# Patient Record
Sex: Female | Born: 1955 | Race: Asian | Hispanic: No | Smoking: Never smoker
Health system: Southern US, Community
[De-identification: ages and names within clinical notes are randomized; demographics above are authoritative.]

## PROBLEM LIST (undated history)

## (undated) HISTORY — PX: NECK SURGERY: SHX720

## (undated) HISTORY — PX: BREAST BIOPSY: SHX20

---

## 2002-07-04 ENCOUNTER — Encounter: Payer: Self-pay | Admitting: Family Medicine

## 2002-07-04 ENCOUNTER — Encounter: Admission: RE | Admit: 2002-07-04 | Discharge: 2002-07-04 | Payer: Self-pay | Admitting: Family Medicine

## 2002-08-19 ENCOUNTER — Encounter: Payer: Self-pay | Admitting: Family Medicine

## 2002-08-19 ENCOUNTER — Encounter: Admission: RE | Admit: 2002-08-19 | Discharge: 2002-08-19 | Payer: Self-pay | Admitting: Family Medicine

## 2002-12-25 ENCOUNTER — Ambulatory Visit (HOSPITAL_COMMUNITY): Admission: RE | Admit: 2002-12-25 | Discharge: 2002-12-25 | Payer: Self-pay | Admitting: Gastroenterology

## 2003-09-02 ENCOUNTER — Other Ambulatory Visit: Admission: RE | Admit: 2003-09-02 | Discharge: 2003-09-02 | Payer: Self-pay | Admitting: Family Medicine

## 2003-09-18 ENCOUNTER — Ambulatory Visit (HOSPITAL_COMMUNITY): Admission: RE | Admit: 2003-09-18 | Discharge: 2003-09-18 | Payer: Self-pay | Admitting: Family Medicine

## 2004-01-26 ENCOUNTER — Encounter: Admission: RE | Admit: 2004-01-26 | Discharge: 2004-02-23 | Payer: Self-pay | Admitting: Family Medicine

## 2004-09-01 ENCOUNTER — Emergency Department (HOSPITAL_COMMUNITY): Admission: EM | Admit: 2004-09-01 | Discharge: 2004-09-01 | Payer: Self-pay | Admitting: Emergency Medicine

## 2004-10-07 ENCOUNTER — Ambulatory Visit (HOSPITAL_COMMUNITY): Admission: RE | Admit: 2004-10-07 | Discharge: 2004-10-07 | Payer: Self-pay | Admitting: *Deleted

## 2005-10-09 ENCOUNTER — Ambulatory Visit (HOSPITAL_COMMUNITY): Admission: RE | Admit: 2005-10-09 | Discharge: 2005-10-09 | Payer: Self-pay | Admitting: Family Medicine

## 2006-03-14 ENCOUNTER — Other Ambulatory Visit: Admission: RE | Admit: 2006-03-14 | Discharge: 2006-03-14 | Payer: Self-pay | Admitting: Family Medicine

## 2006-10-16 ENCOUNTER — Ambulatory Visit (HOSPITAL_COMMUNITY): Admission: RE | Admit: 2006-10-16 | Discharge: 2006-10-16 | Payer: Self-pay | Admitting: Family Medicine

## 2006-12-05 ENCOUNTER — Encounter: Admission: RE | Admit: 2006-12-05 | Discharge: 2006-12-05 | Payer: Self-pay | Admitting: Family Medicine

## 2007-05-05 ENCOUNTER — Emergency Department (HOSPITAL_COMMUNITY): Admission: EM | Admit: 2007-05-05 | Discharge: 2007-05-05 | Payer: Self-pay | Admitting: Emergency Medicine

## 2007-10-13 ENCOUNTER — Emergency Department (HOSPITAL_COMMUNITY): Admission: EM | Admit: 2007-10-13 | Discharge: 2007-10-13 | Payer: Self-pay | Admitting: Emergency Medicine

## 2007-10-25 ENCOUNTER — Emergency Department (HOSPITAL_COMMUNITY): Admission: EM | Admit: 2007-10-25 | Discharge: 2007-10-26 | Payer: Self-pay | Admitting: Emergency Medicine

## 2007-12-09 ENCOUNTER — Encounter: Admission: RE | Admit: 2007-12-09 | Discharge: 2007-12-09 | Payer: Self-pay | Admitting: Family Medicine

## 2008-03-02 ENCOUNTER — Other Ambulatory Visit: Admission: RE | Admit: 2008-03-02 | Discharge: 2008-03-02 | Payer: Self-pay | Admitting: Family Medicine

## 2008-07-01 ENCOUNTER — Inpatient Hospital Stay (HOSPITAL_COMMUNITY): Admission: EM | Admit: 2008-07-01 | Discharge: 2008-07-09 | Payer: Self-pay | Admitting: Emergency Medicine

## 2008-07-06 ENCOUNTER — Encounter (INDEPENDENT_AMBULATORY_CARE_PROVIDER_SITE_OTHER): Payer: Self-pay | Admitting: Diagnostic Radiology

## 2008-07-15 ENCOUNTER — Ambulatory Visit (HOSPITAL_COMMUNITY): Admission: RE | Admit: 2008-07-15 | Discharge: 2008-07-15 | Payer: Self-pay | Admitting: Family Medicine

## 2008-07-24 ENCOUNTER — Ambulatory Visit: Payer: Self-pay | Admitting: Infectious Diseases

## 2008-07-24 ENCOUNTER — Inpatient Hospital Stay (HOSPITAL_COMMUNITY): Admission: AD | Admit: 2008-07-24 | Discharge: 2008-07-31 | Payer: Self-pay | Admitting: Internal Medicine

## 2008-08-06 ENCOUNTER — Encounter: Admission: RE | Admit: 2008-08-06 | Discharge: 2008-08-06 | Payer: Self-pay | Admitting: Pulmonary Disease

## 2008-08-10 ENCOUNTER — Encounter: Payer: Self-pay | Admitting: Infectious Diseases

## 2008-08-10 DIAGNOSIS — F329 Major depressive disorder, single episode, unspecified: Secondary | ICD-10-CM

## 2008-08-10 DIAGNOSIS — R599 Enlarged lymph nodes, unspecified: Secondary | ICD-10-CM | POA: Insufficient documentation

## 2008-08-25 ENCOUNTER — Encounter: Payer: Self-pay | Admitting: Infectious Diseases

## 2008-09-02 ENCOUNTER — Ambulatory Visit: Payer: Self-pay | Admitting: Infectious Diseases

## 2008-09-03 ENCOUNTER — Encounter: Payer: Self-pay | Admitting: Infectious Diseases

## 2008-09-15 ENCOUNTER — Telehealth: Payer: Self-pay

## 2008-10-05 ENCOUNTER — Ambulatory Visit: Payer: Self-pay | Admitting: Infectious Diseases

## 2008-10-05 DIAGNOSIS — A319 Mycobacterial infection, unspecified: Secondary | ICD-10-CM | POA: Insufficient documentation

## 2008-10-05 LAB — CONVERTED CEMR LAB
Basophils Absolute: 0 10*3/uL (ref 0.0–0.1)
Basophils Relative: 1 % (ref 0–1)
Chloride: 104 meq/L (ref 96–112)
Eosinophils Absolute: 0.2 10*3/uL (ref 0.0–0.7)
GFR calc Af Amer: >60 mL/min (ref >60)
Glucose, Bld: 97 mg/dL (ref 70–99)
HCT: 38.8 % (ref 36.0–46.0)
Monocytes Absolute: 0.4 10*3/uL (ref 0.1–1.0)
Monocytes Relative: 7 % (ref 3–12)
Platelets: 234 10*3/uL (ref 150–400)
Potassium: 4.3 meq/L (ref 3.5–5.3)
RBC: 4.81 M/uL (ref 3.87–5.11)
RDW: 13.9 % (ref 11.5–15.5)
Sodium: 141 meq/L (ref 135–145)
Total Bilirubin: 0.2 mg/dL — ABNORMAL LOW (ref 0.3–1.2)
Total Protein: 7.6 g/dL (ref 6.0–8.3)
WBC: 5.3 10*3/uL (ref 4.0–10.5)

## 2008-11-13 ENCOUNTER — Encounter: Payer: Self-pay | Admitting: Infectious Diseases

## 2008-12-14 ENCOUNTER — Encounter: Admission: RE | Admit: 2008-12-14 | Discharge: 2009-01-07 | Payer: Self-pay | Admitting: Family Medicine

## 2009-02-23 ENCOUNTER — Ambulatory Visit (HOSPITAL_COMMUNITY): Admission: RE | Admit: 2009-02-23 | Discharge: 2009-02-23 | Payer: Self-pay | Admitting: Family Medicine

## 2009-11-09 ENCOUNTER — Encounter: Admission: RE | Admit: 2009-11-09 | Discharge: 2009-11-09 | Payer: Self-pay | Admitting: Orthopedic Surgery

## 2010-02-28 ENCOUNTER — Other Ambulatory Visit: Admission: RE | Admit: 2010-02-28 | Discharge: 2010-02-28 | Payer: Self-pay | Admitting: Family Medicine

## 2010-03-21 ENCOUNTER — Encounter: Admission: RE | Admit: 2010-03-21 | Discharge: 2010-03-21 | Payer: Self-pay | Admitting: Family Medicine

## 2010-05-07 ENCOUNTER — Encounter: Payer: Self-pay | Admitting: Family Medicine

## 2010-05-08 ENCOUNTER — Encounter: Payer: Self-pay | Admitting: Family Medicine

## 2010-07-27 LAB — COMPREHENSIVE METABOLIC PANEL
ALT: 31 U/L (ref 0–35)
AST: 15 U/L (ref 0–37)
AST: 20 U/L (ref 0–37)
Albumin: 3 g/dL — ABNORMAL LOW (ref 3.5–5.2)
CO2: 32 mEq/L (ref 19–32)
Calcium: 9.3 mg/dL (ref 8.4–10.5)
Chloride: 100 mEq/L (ref 96–112)
Creatinine, Ser: 0.65 mg/dL (ref 0.4–1.2)
GFR calc Af Amer: >60 mL/min (ref >60)
GFR calc Af Amer: >60 mL/min (ref >60)
GFR calc non Af Amer: >60 mL/min (ref >60)
Glucose, Bld: 105 mg/dL — ABNORMAL HIGH (ref 70–99)
Glucose, Bld: 95 mg/dL (ref 70–99)
Sodium: 139 mEq/L (ref 135–145)
Total Bilirubin: 0.7 mg/dL (ref 0.3–1.2)
Total Protein: 7.1 g/dL (ref 6.0–8.3)

## 2010-07-27 LAB — AFB CULTURE WITH SMEAR (NOT AT ARMC): Acid Fast Smear: NONE SEEN

## 2010-07-27 LAB — CULTURE, ROUTINE-ABSCESS

## 2010-07-27 LAB — POCT I-STAT EG7
Acid-Base Excess: 5 mmol/L — ABNORMAL HIGH (ref 0.0–2.0)
Bicarbonate: 30.6 mEq/L — ABNORMAL HIGH (ref 20.0–24.0)
Calcium, Ion: 1.24 mmol/L (ref 1.12–1.32)
HCT: 44 % (ref 36.0–46.0)
Hemoglobin: 15 g/dL (ref 12.0–15.0)
O2 Saturation: 45 %
Potassium: 4.1 mEq/L (ref 3.5–5.1)
Sodium: 137 mEq/L (ref 135–145)
TCO2: 32 mmol/L (ref 0–100)
pCO2, Ven: 45.8 mmHg (ref 45.0–50.0)
pH, Ven: 7.433 — ABNORMAL HIGH (ref 7.250–7.300)
pO2, Ven: 24 mmHg — CL (ref 30.0–45.0)

## 2010-07-27 LAB — MISCELLANEOUS TEST

## 2010-07-27 LAB — TISSUE CULTURE

## 2010-07-27 LAB — ANAEROBIC CULTURE

## 2010-07-27 LAB — FUNGUS CULTURE W SMEAR: Fungal Smear: NONE SEEN

## 2010-07-28 LAB — POCT I-STAT, CHEM 8
BUN: 9 mg/dL (ref 6–23)
Chloride: 102 mEq/L (ref 96–112)
Sodium: 136 mEq/L (ref 135–145)

## 2010-07-28 LAB — URINE CULTURE
Colony Count: 9000
Special Requests: NEGATIVE

## 2010-07-28 LAB — FUNGUS CULTURE W SMEAR

## 2010-07-28 LAB — QUANTIFERON TB GOLD ASSAY (BLOOD): Interferon Gamma Release Assay: POSITIVE — AB

## 2010-07-28 LAB — CULTURE, BLOOD (ROUTINE X 2)
Culture: NO GROWTH
Culture: NO GROWTH

## 2010-07-28 LAB — URINALYSIS, ROUTINE W REFLEX MICROSCOPIC
Bilirubin Urine: NEGATIVE
Ketones, ur: NEGATIVE mg/dL
Nitrite: NEGATIVE
Urobilinogen, UA: 1 mg/dL (ref 0.0–1.0)

## 2010-07-28 LAB — BASIC METABOLIC PANEL
BUN: 4 mg/dL — ABNORMAL LOW (ref 6–23)
Calcium: 8.7 mg/dL (ref 8.4–10.5)
Chloride: 105 mEq/L (ref 96–112)
Creatinine, Ser: 0.42 mg/dL (ref 0.4–1.2)
GFR calc Af Amer: >60 mL/min (ref >60)
GFR calc non Af Amer: >60 mL/min (ref >60)

## 2010-07-28 LAB — CBC
Hemoglobin: 12.7 g/dL (ref 12.0–15.0)
Hemoglobin: 13.9 g/dL (ref 12.0–15.0)
MCHC: 34.1 g/dL (ref 30.0–36.0)
MCV: 81.4 fL (ref 78.0–100.0)
MCV: 82.7 fL (ref 78.0–100.0)
Platelets: 264 10*3/uL (ref 150–400)
Platelets: 289 10*3/uL (ref 150–400)
RBC: 4.27 MIL/uL (ref 3.87–5.11)
RDW: 12.5 % (ref 11.5–15.5)
RDW: 12.5 % (ref 11.5–15.5)
WBC: 11.8 10*3/uL — ABNORMAL HIGH (ref 4.0–10.5)
WBC: 6.6 10*3/uL (ref 4.0–10.5)
WBC: 9.8 10*3/uL (ref 4.0–10.5)

## 2010-07-28 LAB — CULTURE, ROUTINE-ABSCESS

## 2010-07-28 LAB — APTT: aPTT: 40 seconds — ABNORMAL HIGH (ref 24–37)

## 2010-07-28 LAB — DIFFERENTIAL
Basophils Absolute: 0 10*3/uL (ref 0.0–0.1)
Lymphocytes Relative: 11 % — ABNORMAL LOW (ref 12–46)
Neutro Abs: 10 10*3/uL — ABNORMAL HIGH (ref 1.7–7.7)

## 2010-07-28 LAB — PROTIME-INR: Prothrombin Time: 15.7 seconds — ABNORMAL HIGH (ref 11.6–15.2)

## 2010-08-30 NOTE — Discharge Summary (Signed)
Brooke [name], Brooke [name]                ACCOUNT NO.:  [id number]   MEDICAL RECORD NO.:  [id number]          PATIENT TYPE:  INP   LOCATION:  5006                         FACILITY:  [facility]   PHYSICIAN:  Corinna L. Lendell Caprice, MDDATE OF BIRTH:  12/09/1955   DATE OF ADMISSION:  07/01/2008  DATE OF DISCHARGE:  07/09/2008                               DISCHARGE SUMMARY   DISCHARGE DIAGNOSES:  1. Acute cervical lymphadenitis, PPD placed.  2. Possible pneumonia.   DISCHARGE MEDICATIONS:  Clindamycin 300 mg p.o. q.i.d. until gone.  She  may continue citalopram 20 mg a day, ibuprofen as needed.  Follow up  with Dr. Corliss Blacker tomorrow to check PPD.  Condition, stable.   CONSULTATIONS:  General surgery, otolaryngology, interventional  radiology.   PROCEDURES:  Ultrasound-guided right cervical lymph node biopsy,  condition stable.  Activity ad lib.  Diet regular.   LABS:  Admission white blood cell count 11,000 with 84% neutrophils,  otherwise normal CBC.  At discharge, white count is normal.  PTT 40, INR  1.2.  Basic metabolic panel unremarkable.  Urinalysis negative.  Blood  cultures negative.  Urine culture negative.  Culture of the lymph node  showed moderate white cells, predominantly PMNs grew out moderate  microaerophilic streptococci.  No yeast or fungal elements.  Pathology  showed uncertain cytology, neutrophilic cells consistent with  inflammation/abscess, viable lymphoid tissue not seen.   SPECIAL STUDIES/RADIOLOGY:  CT of the neck on admission showed extensive  adenopathy in the right neck and lesser adenopathy on the left neck with  an unusual tendency towards central liquefaction or necrosis,  indistinctness of the tissue planes of the right neck could be due to  inflammation or tumor.  Two views of the chest showed prominent  interstitial lung markings diffusely, suspicion of focal infiltrate in  the left lower lobe, repeat two views of the chest showed improved left  base aeration  with atelectasis remaining peribronchial thickening.   HISTORY AND HOSPITAL COURSE:  Brooke [name] is a pleasant Filipino female who  was admitted on 03/17 by Dr. Rito Ehrlich for workup of neck swelling.  There was also concern that she had pneumonia.  She did have a cough.  General Surgery was consulted to consider lymph node biopsy.  Subsequently otolaryngology was consulted and felt that this was acute  cervical lymphadenitis based on elevated white cells and fevers.  The  patient had laryngoscopy.  A decision was made to cancel the surgical  procedure and do ultrasound-guided percutaneous biopsy with  Interventional Radiology.  The patient was started on antibiotics,  specifically Avelox.  Her swelling improved.  Her leukocytosis improved,  her fevers resolved.  The culture eventually grew out moderate  microaerophilic streptococci.  When I saw the patient on March 24, I  changed the Avelox to p.o., added clindamycin and placed a PPD aspirate.  The biopsy had been sent for culture but not AFB.  The repeat chest x-  ray had improved.  The patient was  discharged home in improved condition on clindamycin.  I left a message  with primary care physician about the PPD.  If positive, she would need  repeat biopsy and specifically looking for AFB.  A total time on the day  of discharge is 40 minutes.      Corinna L. Lendell Caprice, MD  Electronically Signed     CLS/MEDQ  D:  08/24/2008  T:  08/25/2008  Job:  [id number]

## 2010-08-30 NOTE — H&P (Signed)
NAMESATINA, Brooke [name]                ACCOUNT NO.:  [id number]   MEDICAL RECORD NO.:  [id number]          PATIENT TYPE:  INP   LOCATION:  5114                         FACILITY:  [facility]   PHYSICIAN:  Hollice Espy, M.D.DATE OF BIRTH:  Oct 07, 1955   DATE OF ADMISSION:  07/24/2008  DATE OF DISCHARGE:                              HISTORY & PHYSICAL   PRIMARY CARE PHYSICIAN:  Dr. Pam Drown.   CONSULTANT:  Dr. Enedina Finner, infectious disease.   CHIEF COMPLAINT:  Arthralgias and night sweats.   HISTORY OF PRESENT ILLNESS:  The patient is a 55 year old Filipino  female who was previously on the hospitalist service from July 01, 2008  to July 09, 2008.  She had been previously admitted for some neck  swelling.  When she was admitted, a CT scan of her neck was done which  showed diffuse adenopathy with some of the lymph nodes concerning for  central necrosis.  At the time she was admitted, she was found to have a  white count of 11.8.  The patient was admitted.  [facility] was consulted and the patient underwent a biopsy of her  adenopathy.  Her pathology report which came back noted no evidence of  any intraepithelial lesions or malignancy and a repeat fine-needle  aspiration noted an abundance of neutrophils cells.  At that time, AFB  cultures were not done on the specimen.  In the interim, the patient  continued to improve and she was discharged home.  She also was noted to  have a bacterial pneumonia and might have been put on clindamycin which  completed a coarse of.  PPD was placed at the time of discharge.  When  the patient followed up with her PCP, her PPD was found to be markedly  positive.  The patient subsequently reported no coughing, although her  PCP did note some cough and on further communication with the patient,  she does report some coughing at times which appears to have some sputum  production with it.  The patient at home also developed over  the last  several weeks arthralgias in both her knees, as well as her right  shoulder, subjective fevers and night sweats.  In terms of her weight  loss, she reports about a 19 pound weight loss over the last 4 months.  However, she has been trying to intentionally lose weight from diet and  exercise.  The patient otherwise denies any chest pain or shortness of  breath.  She did complain of some vague abdominal pain, but it is  questionable whether or not she has abdominal discomfort or a focalized  pain.  She says she is having some problems with constipation, although  this may be from just the decreased p.o. intake from lack of appetite at  times.  She also complained of some occasional back pain involving most  of her back, but again this was quite nonspecific.   REVIEW OF SYSTEMS:  Essentially otherwise unremarkable.   PAST MEDICAL HISTORY:  1. Depression.  2. Recent adenopathy during her last hospitalization 2 weeks  ago.  3. The patient reportedly had a previous positive PPD 14 years ago,      which may have been attributed to BCG vaccine, but we are not sure      of these records or if she was treated for that.   MEDICATIONS:  The patient completed a course of clindamycin.  She is  still on Lexapro 20.   ALLERGIES:  NO KNOWN DRUG ALLERGIES.   SOCIAL HISTORY:  No tobacco, alcohol or drug use.   FAMILY HISTORY:  Noncontributory.   PHYSICAL EXAMINATION:  VITAL SIGNS:  On admission are still pending as  she just arrived here.  GENERAL:  She is alert and x3 in no apparent distress.  HEENT:  Normocephalic, atraumatic.  Her mucous membranes are moist.  She  has had some significant right sided adenopathy which is quite tender.  HEART:  Regular rate and rhythm.  S1-S2.  LUNGS:  Clear to auscultation bilaterally.  ABDOMEN:  Soft, nontender, nondistended.  Positive bowel sounds.  EXTREMITIES:  Show no clubbing, cyanosis or edema.  Her knees look to be  slightly swollen with some  generalized discomfort, but no focal  reproducible tenderness.   LABORATORY DATA:  I am ordering an AFB sputum culture x3.   ASSESSMENT/PLAN:  1. Suspected tuberculosis in a patient with night sweats, adenopathy,      positive PPD.  I have discussed with Dr. Ninetta Lights of infectious      disease, who feels that very likely this patient is going to come      back positive for tuberculosis.  We will plan check an AFB and      sputum cultures x3.  We will also in discussion with get [facility] involved again and this time maybe ago for      complete singular node excision and send for AFB cultures as well.      Depending on outcome, will treat.  In the meantime, continue with      airborne isolation and treat only if positive.  2. Depression.  Continue Lexapro.  3. Arthralgias, may be sequelae secondary to tuberculosis.  For now,      we will treat with p.r.n. Tylenol and continue to follow.   If the patient does come back positive, infection control will be  notified so they can follow for treatment of those exposed, which would  include hospital personnel during the patient's previous  hospitalization, as well as the patient's family.      Hollice Espy, M.D.  Electronically Signed     SKK/MEDQ  D:  07/24/2008  T:  07/24/2008  Job:  [id number]   cc:   Lacretia Leigh. Ninetta Lights, M.D.  Pam Drown, M.D.

## 2010-08-30 NOTE — Consult Note (Signed)
Brooke [name], Brooke [name]                ACCOUNT NO.:  [id number]   MEDICAL RECORD NO.:  [id number]          PATIENT TYPE:  INP   LOCATION:  5006                         FACILITY:  [facility]   PHYSICIAN:  Kinnie Scales. Shoemaker, M.D.DATE OF BIRTH:  [birth year]   DATE OF CONSULTATION:  07/03/2008  DATE OF DISCHARGE:                                 CONSULTATION   BRIEF HISTORY OF PRESENT ILLNESS:  The patient is a 55 year old Asian  female without significant past medical history with the exception of  borderline non-insulin-dependent diabetes.  She had been on a weight  loss program for 1 month prior to her admission and lost approximately  22 pounds.  The patient reported several days prior to admission right  neck swelling and pain with fever and chills.  No cough and recent upper  respiratory tract infection, mild sore throat.  She was seen in the  emergency department with significant swelling in the right neck and  admitted for intravenous antibiotic therapy and further workup.  A CT  scan was obtained at the time of her admission on July 01, 2008, and  this showed significant bilateral cervical lymphadenopathy, right side  greater than left with significant interstitial soft tissue changes  consistent with infection.  There was also essential necrosis in the  largest of these lymph nodes.  No evidence of mucosal lesion or mass and  no other significant finding.  The patient had an elevated white blood  cell count at 11,000 and was febrile to 102 degrees Fahrenheit.   PHYSICAL EXAMINATION:  The patient is a healthy-appearing 55 year old  Asian female in no acute distress.  She is alert and oriented and has  normal voicing.  Ears are normal.  Normal nasal cavity is normal.  Oral  cavity, oropharynx 1+ tonsils.  No erythema, ulcer, mass, or lesion.  No  discharge and no evidence of acute infection.  NECK shows diffuse  significant right lateral neck swelling which is tender to palpation.  Minimal erythema and mild neck stiffness.  No significant palpable  adenopathy on the left.   PROCEDURE:  The patient's nasal cavity was anesthetized and a 4-mm  flexible endoscope was passed through the right nostril without  difficulty.  Nasopharynx was patent without ulcer or tumor.  Base of  tongue and supraglottis were normal.  Vocal cord mobility intact.  Vallecula and hypopharynx were all normal.  There was no evidence of  ulcer, mass, or lesion.  The patient has some mucoid secretions in the  upper airway, but no evidence of active infection.   IMPRESSION:  Acute cervical lymphadenitis.   ASSESSMENT/PLAN:  Ms. Vahle presents to the hospital on July 01, 2008,  for evaluation and treatment of progressive right-sided neck swelling.  Etiology appears to be acute cervical lymphadenitis based on elevated  white blood cell count and fever, most likely represents an infectious  process.  Examination including laryngoscopy showed no evidence of  mucosal lesion or ulcer and given her history I doubt that this is  malignant in nature.  The patient had been scheduled to undergo an  excisional  biopsy of the posterior neck lymph node by Dr. Cyndia Bent on the General Surgical Service.  In discussing the patient's  history with Dr. Jamey Ripa, I recommended canceling the surgical procedure  and perhaps undergoing ultrasound-guided needle biopsy of the largest of  these lymph nodes in order to obtain tissue for culture and sensitivity.  She will continue with her currently prescribed intravenous  antibiotics, oral diet as tolerated and further workup and treatment  based on clinical response over the next several days.  These findings  and recommendations were discussed with the patient's hospitalist, Dr.  Rito Ehrlich and the ENT Service to be consulted on an as-needed basis in  the future.           ______________________________  Kinnie Scales Annalee Genta, M.D.     DLS/MEDQ  D:  81/19/1478   T:  07/04/2008  Job:  [id number]

## 2010-08-30 NOTE — Consult Note (Signed)
Brooke, [name]                ACCOUNT NO.:  [id number]   MEDICAL RECORD NO.:  [id number]          PATIENT TYPE:  INP   LOCATION:  5006                         FACILITY:  [facility]   PHYSICIAN:  Currie Paris, M.D.DATE OF BIRTH:  08-25-1955   DATE OF CONSULTATION:  07/02/2008  DATE OF DISCHARGE:                                 CONSULTATION   TIME OF CONSULTATION:  10:25 a.m.   REQUESTING PHYSICIAN:  Dr. Rito Ehrlich with [facility].   CONSULTING SURGEON:  Currie Paris, MD   PRIMARY CARE PHYSICIAN:  Pam Drown, MD   REASON FOR CONSULTATION:  Cervical lymphadenopathy.   HISTORY OF PRESENT ILLNESS:  [name] is a 55 year old Asian female who  has a history of depression and borderline hyperglycemia who presented  to the emergency department with a chief complaint of right-sided neck  pain.  She states that this pain began approximately a week ago and over  the past couple of days, she has begun to notice some right-sided neck  swelling.  She has also had some associated fevers, chills, and night  sweats.  She has had approximately a 20-pound weight loss over the last  1 month but states this is intentional as she was trying to lose weight  to help with her borderline hyperglycemia.  The patient denies any  history of cancers in her family.  She does admit to having a cough for  approximately the last week and painful swallowing, but no shortness of  breath.  The patient also denies any travel history outside of the  country recently.  Her last visit to the [place] is where she  is originally from and where her kids currently lives, was in April  2009.  Her kids did travel from the [place] to see to their mother,  but they just got here 2 days ago which was after the patient began to  get sick.  Once the patient was at the emergency department, a chest x-  ray was obtained which showed an infiltrate in the left lower lobe  consistent with  pneumonia.  She also had a CT of the neck which showed  questionable adenitis or necrosis of her lymph nodes with also  differential including metastatic adenopathy with central necrosis.  Because of this, we were called to see the patient for a lymph node  biopsy.  Also of note, the patient is complaining today of some  abdominal bloating and pain and states that she has not had a bowel  movement for the past 4 days.   REVIEW OF SYSTEMS:  Please see HPI.  Otherwise, all other systems are  negative.   FAMILY HISTORY:  The patient states she has no family history of any  cancers.   PAST MEDICAL HISTORY:  1. Depression.  2. Borderline hyperglycemia.   PAST SURGICAL HISTORY:  There is none.   SOCIAL HISTORY:  The patient is married.  She has 2 children who live in  the [place]  She does not smoke or drink alcohol.  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   ALLERGIES:  NKDA.   MEDICATIONS:  1. Citalopram 20 mg p.o. daily.  2. Ibuprofen as directed on the bottle p.r.n. arthritis.   PHYSICAL EXAMINATION:  GENERAL:  [name] is a 55 year old Asian female  who is very pleasant, well developed, and well nourished and currently  in no acute distress.  VITAL SIGNS:  Temperature 97, temperature max was 101.2; pulse 63;  respirations 18; and blood pressure 93/57.  HEENT:  Head is normocephalic and atraumatic.  Sclerae noninjected.  Pupils are equal, round, and reactive to light.  Ears and nose without  any obvious masses or lesions.  No rhinorrhea.  Mouth is pink and moist.  Throat shows no exudate.  NECK:  Supple.  Trachea is midline.  No thyromegaly.  However, she does  have some right-sided swelling due to adenopathy.  HEART:  Regular rate and rhythm.  Normal S1 and S2.  No murmurs,  gallops, or rubs noted.  A +2 carotid, radial, and pedal pulses  bilaterally.  LUNGS:  Clear to auscultation bilaterally with some slight decrease in  breath sounds on the left  side, but no wheezes, rhonchi, or rales noted.  Respiratory effort is nonlabored.  ABDOMEN:  Soft.  Minimal tenderness in the right mid quadrant with some  bloating.  She does have active bowel sounds.  Otherwise, no other  masses or hernias are noted.  MUSCULOSKELETAL:  All 4 extremities are symmetrical with no cyanosis,  clubbing, or edema.  LYMPH NODES:  The patient does have a positive right-sided cervical  lymphadenopathy that are extremely tender and very obvious.  These lymph  nodes are very difficult to further examine as the patient has  tremendous amount of pain with palpation.  Therefore, at this time, it  is hard to determine if the lymph nodes are mobile or fixed.  The  patient does not have any pre or postauricular lymphadenopathy,  submental, occipital, or clavicular lymphadenopathy.  NEURO:  Cranial nerves II through XII appear to be grossly intact.  PSYCH:  The patient is alert and oriented x3 with an appropriate affect.   LABORATORY DATA AND DIAGNOSTICS:  White blood cell count is 7800,  hemoglobin 13.9, hematocrit 41.2, and platelets 264,000.  Sodium 136,  potassium 3.7, glucose 112, BUN 9, and creatinine 0.7.  Chest x-ray  showed an infiltrate in the left lower lobe consistent with pneumonia.  CT of the neck shows extensive adenopathy in the right neck with  questionable necrotic lymph node with a differential including reactive  nodes and inflammation, tumor, adenitis, or metastatic adenopathy with  central necrosis with the largest lymph node measuring 15 x 17 x 26 mm.   IMPRESSION:  1. Left lower lobe pneumonia.  2. Cervical lymphadenopathy.  3. Obstipation.   PLAN:  At this time to help her obstipation, we will give the patient  MiraLax 17 g in 8 ounces of liquid as well as prune juice which has  already been ordered.  Otherwise, as far as her neck is concerned, we  will make the patient  n.p.o. after midnight for possible OR tomorrow for a lymph node  biopsy.  Otherwise, at this time, we will obtain a consent from the patient for  this procedure.  The procedure has been explained to the patient and at  this time, she gives her understanding of the procedure and wishes to  proceed.       Letha Cape, PA  Currie Paris, M.D.  Electronically Signed    KEO/MEDQ  D:  07/02/2008  T:  07/03/2008  Job:  [id number]   cc:   Dr. Lazaro Arms, M.D.

## 2010-08-30 NOTE — H&P (Signed)
Brooke [name], Brooke [name]                ACCOUNT NO.:  [id number]   MEDICAL RECORD NO.:  [id number]          PATIENT TYPE:  EMS   LOCATION:  MAJO                         FACILITY:  [facility]   PHYSICIAN:  Hollice Espy, M.D.DATE OF BIRTH:  Dec 27, 1955   DATE OF ADMISSION:  07/01/2008  DATE OF DISCHARGE:                              HISTORY & PHYSICAL   PRIMARY CARE PHYSICIAN:  Pam Drown, M.D.   CHIEF COMPLAINT:  Neck pain.   HISTORY OF PRESENT ILLNESS:  Patient is a 55 year old Asian female with  past medical history of depression and glucose intolerance who for the  last for 4 weeks has been trying to lose weight because of borderline  elevated blood sugars.  She reports about a 20 pound weight loss in the  past 4 weeks but she said that she thinks that this is intentional.  However, in the last few days she started having some complaints of neck  pain and swelling to the point where she had some difficulty swallowing.  She became concerned and came into the emergency room for further  evaluation.  She had noted no cough.  No additional shortness of breath.  No chest pain, no other symptoms.  When she was in the emergency room  she was noted have a borderline elevated white blood cell count of 11.8  with an 84% shift.  A chest x-ray done noted a suspicion of focal  infiltrates in the left lower lobe and some  prominent interstitial lung  markings.  More concerning was a CT scan of her neck which noted diffuse  adenopathy with some areas of central necrosis concerning for  differential diagnosis including adenitis, metastatic disease, although  kind of unusual with the decrease in size or other findings.  With these  findings and the patient's ongoing pain and dysphagia, it was felt best  that she come in for further evaluation.  When I saw the patient she was  doing okay.  She complained again of some severe neck pain and some  difficulty swallowing.  She had no real painful  swelling, no sore  throat, but she it is more when she tries to swallow she can feel some  problems with this.  She denies any headaches or vision changes.  No  chest pain, palpitations, shortness of breath, wheezing or coughing.  No  abdominal pain, no hematuria, dysuria, constipation, diarrhea, focal  weakness, numbness, weakness, pain.  Review of systems otherwise  negative.   PAST MEDICAL HISTORY:  Includes depression and borderline glucose  intolerance.   MEDICATIONS:  She is just on citalopram 20 p.o. daily.   ALLERGIES:  She has no known drug allergies.   SOCIAL HISTORY:  She denies any tobacco, alcohol or drug use.   FAMILY HISTORY:  Noncontributory.   PHYSICAL EXAMINATION:  VITALS:  Admission temperature 97.3, heart rate  81, blood pressure 102/68, respirations 24, O2 saturation 98% on room  air, respiratory rate 16.  GENERAL:  She is alert and oriented x3, in some mild distress secondary  to neck pain.  HEENT:  Normocephalic, atraumatic.  Mucous membranes are slightly dry.  She has no carotid bruits.  The right side of her neck appears to be  somewhat swollen with palpable adenopathy that is quite tender.  HEART:  Regular rhythm.  S1, S2.  LUNGS:  Clear to auscultation bilaterally.  ABDOMEN:  Soft, nontender, nondistended.  Positive bowel sounds.  EXTREMITIES:  Show no clubbing, cyanosis or edema.   LABORATORY WORK:  White count 11.8 with an 84% shift.  H and H 13.9 and  41.  MCV 82, platelet count 264.  Sodium 136, potassium 2.7, chloride  102, BUN 9, creatinine 0.7, glucose 112.  Chest x-ray and CT scan of her  neck are as per HPI.   ASSESSMENT/PLAN:  1. Pneumonia.  2. Neck pain with adenopathy and necrosis.  Intravenous antibiotics.      Surgery consult from [facility] for lymph node      removal and evaluation.      Hollice Espy, M.D.  Electronically Signed     SKK/MEDQ  D:  07/01/2008  T:  07/01/2008  Job:  [id number]   cc:   Pam Drown, M.D.

## 2010-08-30 NOTE — Op Note (Signed)
NAMEVANDA, WASKEY                ACCOUNT NO.:  [id number]   MEDICAL RECORD NO.:  [id number]          PATIENT TYPE:  INP   LOCATION:  5114                         FACILITY:  [facility]   PHYSICIAN:  Kinnie Scales. Annalee Genta, M.D.DATE OF BIRTH:  Dec 04, 1955   DATE OF PROCEDURE:  07/28/2008  DATE OF DISCHARGE:                               OPERATIVE REPORT   PREOPERATIVE DIAGNOSIS:  Chronic right cervical lymphadenopathy with  necrotic changes.   POSTOPERATIVE DIAGNOSIS:  Chronic right cervical lymphadenopathy with  necrotic changes.   INDICATIONS FOR SURGERY:  Chronic right cervical lymphadenopathy with  necrotic changes.   SURGICAL PROCEDURES:  Incision and drainage of right neck necrotic lymph  node with culture and biopsy obtained.   ANESTHESIA:  General endotracheal.   SURGEON:  Kinnie Scales. Annalee Genta, MD   COMPLICATIONS:  None.   ESTIMATED BLOOD LOSS:  Less than 50 mL.  The patient transferred from  the operating room to the recovery room in stable condition.   BRIEF HISTORY:  The patient is a 55 year old female a Filipino origin  who has been followed and treated at [facility] for recurrent  fevers, neck swelling, and arthralgias.  She was initially admitted in  mid March with a significant weight loss, right-sided neck pain, and  cervical lymphadenopathy.  CT scan at that time showed multiple lymph  nodes with necrotic centers.  She has undergone biopsy followed by fine-  needle aspiration without pathologic diagnosis.  She was discharged and  treated with clindamycin with some initial improvement in clinical  symptoms following conclusion of antibiotic therapy.  She developed  continued symptoms of right-sided neck pain, arthralgias, and  intermittent fevers.  She was readmitted to the hospital on July 24, 2008.  Infection Disease Service was consulted and the patient was  worked up for additional infection including possible AFB versus other  bacterial etiology for  chronic symptoms.  Initial sputum cultures for TV  were negative.  Given the patient's history, a CT scan was obtained,  which showed continued bilateral cervical lymphadenopathy with areas  number of central necrosis primarily on the right hand side in the mid  and upper jugulodigastric region.  Given her history and physical  examination, I recommended incision and drainage with cultures.  The  risks, benefits, and possible complications of the procedure were  discussed in detail and the patient understood and concurred with our  plan for surgery which is scheduled on semi-urgent basis at [facility] on July 28, 2008.   PROCEDURE:  The patient was brought to the operating room at [facility] and placed in supine position on the operating table.  General  endotracheal anesthesia was established without difficulty.  When the  patient was adequately anesthetized, she was injected with a total of 1  mL of 1% lidocaine with 1:100,000 solution epinephrine was injected into  the skin overlying the right lateral neck swelling.  She was then  prepped and draped in sterile fashion.  The surgical procedure was begun  by creating a 1-cm horizontally oriented skin incision which was carried  through the skin and underlying platysma muscle.  Subplatysmal flaps  were elevated.  The anterior aspect of the sternocleidomastoid muscle  was identified and blunt dissection was then undertaken.  A large firm  mass consistent with chronic lymph node was then identified.  There was  significant amount of inflammation and scarring surrounding the area  from her chronic infection.  Using a blunt forceps, the area was  carefully dissected and a moderate amount of purulent material was  expressed from the central aspect of the necrotic lymph node.  Multiple  cultures were taken for aerobic, anaerobic, and Gram stain.  We also  obtained cultures and soft tissue for AFB cultures and cytology per  the  Infectious Disease Service.  The patient's wound was then thoroughly  irrigated with saline solution.  A 0.25-inch Penrose drain was placed at  the depth of the incision and sutured into position with 3-0 Ethilon  suture.  The patient's neck was then dressed with 4 x 4 gauze and 4-inch  Kerlix.  She was awakened from anesthetic, extubated and then  transferred from the operating room to the recovery room in stable  condition.  No complications and blood loss less than 50 mL.           ______________________________  Kinnie Scales. Annalee Genta, M.D.     DLS/MEDQ  D:  16/01/9603  T:  07/28/2008  Job:  [id number]

## 2010-08-30 NOTE — Discharge Summary (Signed)
[name], [name]                ACCOUNT NO.:  [id number]   MEDICAL RECORD NO.:  [id number]          PATIENT TYPE:  INP   LOCATION:  5114                         FACILITY:  [facility]   PHYSICIAN:  Brooke Harvest, MD    DATE OF BIRTH:  22-Apr-1955   DATE OF ADMISSION:  07/24/2008  DATE OF DISCHARGE:  07/31/2008                               DISCHARGE SUMMARY   PRIMARY CARE PHYSICIAN:  Dr. Gweneth [name] of [facility].Marland Kitchen   DISCHARGE DIAGNOSES:  1. 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  2. Depression.  3. Recent adenopathy during last hospitalization, 2-3 weeks prior to      admission.  4. 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   DISCHARGE MEDICATIONS:  1. Celexa 20 mg p.o. daily.  2. Rifampin 600 mg p.o. daily.  3. Isoniazid 300 mg p.o. daily  4. Ethambutol 1  gram p.o. daily  5. Pyrazinamide 1000 mg p.o. daily.  6. Vitamin Brooke 650 mg p.o. q. Daily.  7. Dulcolax 10 mg p.o. daily p.r.Brooke. constipation.   The patient's anti-TB medications will probably be furnished by the  Health Department.  Health Department will be contacted on discharge to  help with patient medication and prophylaxis for the patient's family.   DISPOSITION AND FOLLOW-UP:  The patient will be discharged home.  The  patient is to follow up with Dr. Annalee [name] of ENT in 2 weeks.  The  patient is also to follow up with Dr. Johny [name] on Sep 02, 2008 at  11:00 [name].m.  Health Department will be notified of the patient's  discharge and as such the Health Department will follow up with the  patient as an outpatient for her medications and prophylaxis for family  treatment.  The patient is also to follow up with PCP in 2 weeks.  On  follow-up the patient will need [name] comprehensive metabolic profile to  follow up on electrolytes and liver function test, as the patient is on  anti-TB medications.   CONSULTATIONS DONE.:  1. Infectious disease consult was done.  The patient was seen in      consultation by Dr. Johny [name] of infectious disease on July 24, 2008.  2. ENT consult was done.  The patient was seen by Dr. Annalee [name] on      July 27, 2008.   PROCEDURES PERFORMED.:  1. Incision and drainage of the right neck necrotic lymph node with      culture and biopsy were obtained by Dr. Annalee [name] on July 28, 2008.  2. CT of the neck with contrast was done on July 27, 2008 that showed      continued necrotic adenopathy in the neck bilaterally.  Although      this can be seen with metastatic squamous cell cancer, based on the      patient's history this most likely represents nodes involved with  tuberculosis infection.  These have formed [name] confluent mass in the      right jugulodigastric region, measuring 4.6 x 3.7 x 2.7 in maximum      dimensions.  No discrete abscess separate from the and large lymph      nodes was seen.  Patchy opacities in both upper lobes, greater on      the right, most likely representing the patient's active      tuberculosis.   ADMISSION HISTORY AND PHYSICAL:  Brooke [name] is [name] 55 year old  Filipino female who was previously on the hospitalist service from July 01, 2008 through July 09, 2008.  The patient had previously been  admitted for neck swelling.  The patient was admitted.  CT scan of the  neck was done which showed diffuse adenopathy with some of the lymph  nodes concerning for central necrosis.  At the time the patient was  admitted she was found to have [name] white count of 11.8.  The patient was  admitted.  [facility] was consulted.  The patient  underwent biopsy of adenopathy.  Pathology report which came back noted  no evidence of any intraepithelial lesions or malignancy and [name] repeat  fine-needle aspiration noted an abundance of neutrophil cells.  At that  time, AFB cultures were not done on the specimen.   In the interim, the  patient continued to improve and was discharged home.  The patient was  noted to also have [name] bacterial pneumonia and might have been put on  clindamycin which she completed [name] course of.  Her PPD was placed at the  time of discharge and the patient followed up with her PCP.  Her PPD was  found to be markedly positive.  The patient was subsequently reporting  no cough, although PCP did note some cough and, on further communication  with the patient, she did report some mild coughing at times which  appeared to have some sputum production with it.  The patient was at  home and also developed over the last several weeks arthralgias in both  her knees, as well as her right shoulder, subjective fevers and night  sweats.  In terms of her weight loss, the patient reported [name] 19-pound  weight loss over the past 4 months.  However, the patient had been  trying to intentionally lose weight from diet and exercise.  The patient  otherwise denies any chest pain or shortness of breath.  The patient did  complain of some vague abdominal pain, but it is questionable whether or  not she had abdominal discomfort or focalized pain.  The patient also  stated that she had been having some problems with constipation,  although this may be from just decreased p.o. intake from lack of  appetite.  At times, the patient also complained of some occasional back  pain, involving most of her back, but again was quite nonspecific.   PHYSICAL EXAM:  Per admitting physician.  GENERAL:  The patient was alert and oriented x3.  HEENT: Normocephalic, atraumatic.  Moist mucous membranes.  Significant  right-sided adenopathy which was quite tender.  CARDIOVASCULAR:  Regular rate and rhythm, S1, S2.  RESPIRATORY:  Lungs were clear to auscultation bilaterally.  ABDOMEN:  Soft, nontender, nondistended, positive bowel sounds.  EXTREMITIES: No clubbing, cyanosis or edema.  Knees looked to be  slightly swollen  with some generalized discomfort but no focal or  reproducible tenderness.   HOSPITAL COURSE:  1. Suspected TB  lymphadenitis:  The patient was admitted with [name]      suspected TB lymphadenitis in the setting of night sweats,      adenopathy, positive PPD and subjective fevers, and infectious      disease consult was done.  The patient was seen in consultation by      Dr. Johny [name] of infectious disease.  The patient was checked      for AFB sputum cultures x3 which came back negative.  The patient      was put in respiratory isolation and was monitored.  An HIV test      was also obtained, which came back nonreactive.  ENT was consulted.      The patient was seen in consultation by Dr. Annalee [name] on July 27, 2008.  [name] CT scan of the neck was done then with results as stated      above.  The patient was then taken to the operating room on July 28, 2008, where incision and drainage of the right neck necrotic      lymph node with culture and biopsy were obtained.  Fungal cultures      came back preliminary negative and were pending at the time of      discharge.  Tissue cultures were also pending at the time of      discharge and initially were negative for Bartonella with and no      organisms seen and no growth done in 3 days.  The patient had some      complaints of arthralgias.  The patient was started on [name] four-drug      TB regimen on July 28, 2008 and monitored.  The patient improved      symptomatically on this throughout the hospitalization.  The      patient had [name] drain which was placed and drained well.  Drain was      then removed on July 31, 2008.  It was felt from ENT perspective      an infectious disease perspective that the patient was stable for      discharge home.  The patient will be discharged home in stable and      improved condition.  The health department will be notified of the      patient's discharge such that the patient will be aided by the       health department in terms of medications as well as prophylaxis      for her immediate family and close contacts.  On day of discharge      the patient will be discharged in stable condition.  The patient      will be discharged home with 4 x 4 gauze changes for the next 48      hours with hydrogen peroxide to be used to clean around her wound      site, if needed.  The patient will follow-up with ENT and ID as      stated above.   The rest of patient's chronic medical issues were stable throughout the  hospitalization and the patient will be discharged in stable and  improved condition.  On day of discharge, vital signs were temperature  98.0, pulse of 67, blood pressure 97/67, respiratory rate 12, satting  99% on room air.   It was [name] pleasure taking care of Brooke [name].      Brooke Harvest, MD  Electronically Signed     DT/MEDQ  D:  07/31/2008  T:  07/31/2008  Job:  [id number]   cc:   Pam Drown, M.D.  Lacretia Leigh. Ninetta Lights, M.D.  Kinnie Scales. Brooke [name], M.D.

## 2010-09-02 NOTE — Op Note (Signed)
   NAME:  Brooke [name], Brooke [name]                          ACCOUNT NO.:  [id number]   MEDICAL RECORD NO.:  [id number]                   PATIENT TYPE:  AMB   LOCATION:  ENDO                                 FACILITY:  [facility]   PHYSICIAN:  Danise Edge, M.D.                DATE OF BIRTH:  07-16-1955   DATE OF PROCEDURE:  12/25/2002  DATE OF DISCHARGE:                                 OPERATIVE REPORT   INDICATIONS FOR PROCEDURE:  Brooke [name] is a 55 year old female born  Mar 28, 1956. Approximately 3 years ago Ms. Melendrez underwent a colonoscopy  performed in [place], [place]; a colon polyp  was removed.   ENDOSCOPIST:  Danise Edge, M.D.   PREMEDICATIONS:  Versed 7.5 mg, Demerol 50 mg.   PROCEDURE:  After informed consent was obtained Ms. Bias was placed in the  left lateral decubitus position. I administered intravenous Demerol and  intravenous Versed to achieve conscious sedation for the procedure. The  patient's blood pressure, oxygen saturation and cardiac  rhythm were  monitored throughout the procedure and documented in the medical record.   Anal inspection was normal. Digital rectal examination was normal. The  Olympus pediatric adjustable colonoscope was introduced into the rectum and  advanced  to the cecum. Colonic preparation for the examination today was  excellent.   Rectum normal.   Sigmoid colon and descending colon normal.   Splenic flexure normal.   Transverse colon normal.   Hepatic flexure normal.   Ascending colon normal.   Cecum and ileocecal valve normal.    ASSESSMENT:  Normal screening proctocolonoscopy to the cecum. No endoscopic  evidence  for the presence of colorectal neoplasia.   RECOMMENDATIONS:  Repeat colonoscopy at age 57.                                               Danise Edge, M.D.    MJ/MEDQ  D:  12/25/2002  T:  12/26/2002  Job:  [id number]   cc:   Dellis Anes. Idell Pickles, M.D.  [address]  [place]  [place] [address]  Fax: [phone]

## 2011-01-12 LAB — POCT I-STAT, CHEM 8
BUN: 16
Calcium, Ion: 1.19
Chloride: 104
Sodium: 140

## 2011-01-12 LAB — URINE MICROSCOPIC-ADD ON

## 2011-01-12 LAB — CBC
Hemoglobin: 14
RDW: 12.6
WBC: 9.9

## 2011-01-12 LAB — URINALYSIS, ROUTINE W REFLEX MICROSCOPIC
Glucose, UA: NEGATIVE
pH: 7.5

## 2011-01-12 LAB — DIFFERENTIAL
Basophils Absolute: 0
Lymphocytes Relative: 12
Lymphs Abs: 1.1
Monocytes Absolute: 0.4
Neutro Abs: 8.2 — ABNORMAL HIGH

## 2011-01-12 LAB — URINE CULTURE

## 2011-03-13 ENCOUNTER — Other Ambulatory Visit: Payer: Self-pay | Admitting: Family Medicine

## 2011-03-13 ENCOUNTER — Other Ambulatory Visit (HOSPITAL_COMMUNITY)
Admission: RE | Admit: 2011-03-13 | Discharge: 2011-03-13 | Disposition: A | Discharge status: Home / Self Care | Payer: BC Managed Care – PPO | Source: Ambulatory Visit | Attending: Family Medicine | Admitting: Family Medicine

## 2011-03-13 DIAGNOSIS — Z124 Encounter for screening for malignant neoplasm of cervix: Secondary | ICD-10-CM | POA: Insufficient documentation

## 2011-03-13 DIAGNOSIS — Z1159 Encounter for screening for other viral diseases: Secondary | ICD-10-CM | POA: Insufficient documentation

## 2011-03-16 ENCOUNTER — Other Ambulatory Visit (HOSPITAL_COMMUNITY): Payer: Self-pay | Admitting: Family Medicine

## 2011-03-16 DIAGNOSIS — Z1231 Encounter for screening mammogram for malignant neoplasm of breast: Secondary | ICD-10-CM

## 2011-04-24 ENCOUNTER — Ambulatory Visit (HOSPITAL_COMMUNITY)
Admission: RE | Admit: 2011-04-24 | Discharge: 2011-04-24 | Disposition: A | Discharge status: Home / Self Care | Payer: BC Managed Care – PPO | Source: Ambulatory Visit | Attending: Family Medicine | Admitting: Family Medicine

## 2011-04-24 DIAGNOSIS — Z1231 Encounter for screening mammogram for malignant neoplasm of breast: Secondary | ICD-10-CM | POA: Insufficient documentation

## 2011-06-07 ENCOUNTER — Other Ambulatory Visit: Payer: Self-pay | Admitting: Family Medicine

## 2011-06-07 DIAGNOSIS — R591 Generalized enlarged lymph nodes: Secondary | ICD-10-CM

## 2011-06-13 ENCOUNTER — Ambulatory Visit
Admission: RE | Admit: 2011-06-13 | Discharge: 2011-06-13 | Disposition: A | Discharge status: Home / Self Care | Payer: BC Managed Care – PPO | Source: Ambulatory Visit | Attending: Family Medicine | Admitting: Family Medicine

## 2011-06-13 DIAGNOSIS — R591 Generalized enlarged lymph nodes: Secondary | ICD-10-CM

## 2011-06-13 MED ORDER — IOHEXOL 300 MG/ML  SOLN
75.0000 mL | Freq: Once | INTRAMUSCULAR | Status: AC | PRN
  Administered 2011-06-13: 75 mL via INTRAVENOUS

## 2012-05-17 ENCOUNTER — Other Ambulatory Visit (HOSPITAL_COMMUNITY): Payer: Self-pay | Admitting: Family Medicine

## 2012-05-17 DIAGNOSIS — Z1231 Encounter for screening mammogram for malignant neoplasm of breast: Secondary | ICD-10-CM

## 2012-05-29 ENCOUNTER — Ambulatory Visit (HOSPITAL_COMMUNITY): Payer: BC Managed Care – PPO

## 2012-06-07 ENCOUNTER — Ambulatory Visit (HOSPITAL_COMMUNITY): Payer: BC Managed Care – PPO

## 2012-06-17 ENCOUNTER — Other Ambulatory Visit (HOSPITAL_COMMUNITY): Payer: Self-pay | Admitting: Family Medicine

## 2012-06-17 DIAGNOSIS — Z1231 Encounter for screening mammogram for malignant neoplasm of breast: Secondary | ICD-10-CM

## 2012-06-26 ENCOUNTER — Ambulatory Visit (HOSPITAL_COMMUNITY)
Admission: RE | Admit: 2012-06-26 | Discharge: 2012-06-26 | Disposition: A | Discharge status: Home / Self Care | Payer: BC Managed Care – PPO | Source: Ambulatory Visit | Attending: Family Medicine | Admitting: Family Medicine

## 2012-06-26 DIAGNOSIS — Z1231 Encounter for screening mammogram for malignant neoplasm of breast: Secondary | ICD-10-CM | POA: Insufficient documentation

## 2013-05-16 ENCOUNTER — Ambulatory Visit
Admission: RE | Admit: 2013-05-16 | Discharge: 2013-05-16 | Disposition: A | Discharge status: Home / Self Care | Payer: BC Managed Care – PPO | Source: Ambulatory Visit | Attending: Family Medicine | Admitting: Family Medicine

## 2013-05-16 ENCOUNTER — Other Ambulatory Visit: Payer: Self-pay | Admitting: Family Medicine

## 2013-05-16 DIAGNOSIS — R109 Unspecified abdominal pain: Secondary | ICD-10-CM

## 2013-05-16 DIAGNOSIS — K5900 Constipation, unspecified: Secondary | ICD-10-CM

## 2013-05-16 DIAGNOSIS — R0602 Shortness of breath: Secondary | ICD-10-CM

## 2013-05-26 ENCOUNTER — Other Ambulatory Visit: Payer: Self-pay | Admitting: Family Medicine

## 2013-05-26 DIAGNOSIS — R748 Abnormal levels of other serum enzymes: Secondary | ICD-10-CM

## 2013-05-26 DIAGNOSIS — R109 Unspecified abdominal pain: Secondary | ICD-10-CM

## 2013-05-28 ENCOUNTER — Ambulatory Visit
Admission: RE | Admit: 2013-05-28 | Discharge: 2013-05-28 | Disposition: A | Discharge status: Home / Self Care | Payer: BC Managed Care – PPO | Source: Ambulatory Visit | Attending: Family Medicine | Admitting: Family Medicine

## 2013-05-28 ENCOUNTER — Other Ambulatory Visit: Payer: BC Managed Care – PPO

## 2013-05-28 DIAGNOSIS — R748 Abnormal levels of other serum enzymes: Secondary | ICD-10-CM

## 2013-05-28 DIAGNOSIS — R109 Unspecified abdominal pain: Secondary | ICD-10-CM

## 2013-05-28 MED ORDER — IOHEXOL 300 MG/ML  SOLN
100.0000 mL | Freq: Once | INTRAMUSCULAR | Status: AC | PRN
  Administered 2013-05-28: 100 mL via INTRAVENOUS

## 2013-07-03 ENCOUNTER — Other Ambulatory Visit (HOSPITAL_COMMUNITY): Payer: Self-pay | Admitting: Diagnostic Radiology

## 2013-07-03 DIAGNOSIS — Z1231 Encounter for screening mammogram for malignant neoplasm of breast: Secondary | ICD-10-CM

## 2013-07-16 ENCOUNTER — Ambulatory Visit (HOSPITAL_COMMUNITY)
Admission: RE | Admit: 2013-07-16 | Discharge: 2013-07-16 | Disposition: A | Discharge status: Home / Self Care | Payer: BC Managed Care – PPO | Source: Ambulatory Visit | Attending: Diagnostic Radiology | Admitting: Diagnostic Radiology

## 2013-07-16 DIAGNOSIS — Z1231 Encounter for screening mammogram for malignant neoplasm of breast: Secondary | ICD-10-CM

## 2013-10-13 ENCOUNTER — Other Ambulatory Visit: Payer: Self-pay | Admitting: Family Medicine

## 2013-10-13 DIAGNOSIS — R1310 Dysphagia, unspecified: Secondary | ICD-10-CM

## 2013-10-16 ENCOUNTER — Ambulatory Visit
Admission: RE | Admit: 2013-10-16 | Discharge: 2013-10-16 | Disposition: A | Discharge status: Home / Self Care | Payer: BC Managed Care – PPO | Source: Ambulatory Visit | Attending: Family Medicine | Admitting: Family Medicine

## 2013-10-16 DIAGNOSIS — R1310 Dysphagia, unspecified: Secondary | ICD-10-CM

## 2014-01-03 ENCOUNTER — Emergency Department (HOSPITAL_COMMUNITY): Payer: BC Managed Care – PPO

## 2014-01-03 ENCOUNTER — Encounter (HOSPITAL_COMMUNITY): Payer: Self-pay | Admitting: Emergency Medicine

## 2014-01-03 ENCOUNTER — Emergency Department (HOSPITAL_COMMUNITY)
Admission: EM | Admit: 2014-01-03 | Discharge: 2014-01-03 | Disposition: A | Discharge status: Home / Self Care | Payer: BC Managed Care – PPO | Attending: Emergency Medicine | Admitting: Emergency Medicine

## 2014-01-03 DIAGNOSIS — Z3202 Encounter for pregnancy test, result negative: Secondary | ICD-10-CM | POA: Diagnosis not present

## 2014-01-03 DIAGNOSIS — M545 Low back pain, unspecified: Secondary | ICD-10-CM | POA: Insufficient documentation

## 2014-01-03 DIAGNOSIS — M5489 Other dorsalgia: Secondary | ICD-10-CM

## 2014-01-03 DIAGNOSIS — R109 Unspecified abdominal pain: Secondary | ICD-10-CM

## 2014-01-03 DIAGNOSIS — R1031 Right lower quadrant pain: Secondary | ICD-10-CM | POA: Diagnosis not present

## 2014-01-03 LAB — CBC WITH DIFFERENTIAL/PLATELET
BASOS PCT: 1 % (ref 0–1)
Basophils Absolute: 0 10*3/uL (ref 0.0–0.1)
EOS ABS: 0.2 10*3/uL (ref 0.0–0.7)
Eosinophils Relative: 3 % (ref 0–5)
HEMATOCRIT: 41.5 % (ref 36.0–46.0)
HEMOGLOBIN: 14.2 g/dL (ref 12.0–15.0)
Lymphocytes Relative: 33 % (ref 12–46)
Lymphs Abs: 2.1 10*3/uL (ref 0.7–4.0)
MCH: 28.1 pg (ref 26.0–34.0)
MCHC: 34.2 g/dL (ref 30.0–36.0)
MCV: 82 fL (ref 78.0–100.0)
MONO ABS: 0.4 10*3/uL (ref 0.1–1.0)
MONOS PCT: 7 % (ref 3–12)
Neutro Abs: 3.7 10*3/uL (ref 1.7–7.7)
Neutrophils Relative %: 56 % (ref 43–77)
Platelets: 247 10*3/uL (ref 150–400)
RBC: 5.06 MIL/uL (ref 3.87–5.11)
RDW: 12.7 % (ref 11.5–15.5)
WBC: 6.6 10*3/uL (ref 4.0–10.5)

## 2014-01-03 LAB — URINALYSIS, ROUTINE W REFLEX MICROSCOPIC
Bilirubin Urine: NEGATIVE
GLUCOSE, UA: NEGATIVE mg/dL
Hgb urine dipstick: NEGATIVE
KETONES UR: NEGATIVE mg/dL
Leukocytes, UA: NEGATIVE
Nitrite: NEGATIVE
PH: 5.5 (ref 5.0–8.0)
Protein, ur: NEGATIVE mg/dL
Specific Gravity, Urine: 1.018 (ref 1.005–1.030)
Urobilinogen, UA: 0.2 mg/dL (ref 0.0–1.0)

## 2014-01-03 LAB — COMPREHENSIVE METABOLIC PANEL
ALBUMIN: 3.9 g/dL (ref 3.5–5.2)
ALT: 44 U/L — ABNORMAL HIGH (ref 0–35)
ANION GAP: 13 (ref 5–15)
AST: 24 U/L (ref 0–37)
Alkaline Phosphatase: 91 U/L (ref 39–117)
BILIRUBIN TOTAL: 0.3 mg/dL (ref 0.3–1.2)
BUN: 18 mg/dL (ref 6–23)
CO2: 27 mEq/L (ref 19–32)
Calcium: 9.5 mg/dL (ref 8.4–10.5)
Chloride: 102 mEq/L (ref 96–112)
Creatinine, Ser: 0.58 mg/dL (ref 0.50–1.10)
GFR calc Af Amer: >90 mL/min (ref >90)
GFR calc non Af Amer: >90 mL/min (ref >90)
Glucose, Bld: 106 mg/dL — ABNORMAL HIGH (ref 70–99)
Potassium: 4.2 mEq/L (ref 3.7–5.3)
Sodium: 142 mEq/L (ref 137–147)
TOTAL PROTEIN: 7.6 g/dL (ref 6.0–8.3)

## 2014-01-03 LAB — PREGNANCY, URINE: Preg Test, Ur: NEGATIVE

## 2014-01-03 LAB — LIPASE, BLOOD: Lipase: 51 U/L (ref 11–59)

## 2014-01-03 MED ORDER — IBUPROFEN 400 MG PO TABS: 400.0000 mg | ORAL_TABLET | Freq: Four times a day (QID) | ORAL | Status: AC | PRN

## 2014-01-03 MED ORDER — IOHEXOL 300 MG/ML  SOLN
100.0000 mL | Freq: Once | INTRAMUSCULAR | Status: AC | PRN
  Administered 2014-01-03: 100 mL via INTRAVENOUS

## 2014-01-03 MED ORDER — FENTANYL CITRATE 0.05 MG/ML IJ SOLN
50.0000 ug | Freq: Once | INTRAMUSCULAR | Status: AC
  Administered 2014-01-03: 50 ug via INTRAVENOUS
  Filled 2014-01-03: qty 2

## 2014-01-03 MED ORDER — IOHEXOL 300 MG/ML  SOLN
25.0000 mL | INTRAMUSCULAR | Status: DC | PRN
  Administered 2014-01-03: 25 mL via ORAL

## 2014-01-03 NOTE — ED Notes (Signed)
Pt placed on monitor upon return to room from radiology. Pt monitored by blood pressure, pulse ox, and 12 lead. Pts family remains at bedside.

## 2014-01-03 NOTE — ED Notes (Signed)
Pt placed on monitor. Pt monitored by blood pressure, pulse ox, and 5 lead. Pts family remains at bedside.  

## 2014-01-03 NOTE — ED Provider Notes (Signed)
Medical screening examination/treatment/procedure(s) were conducted as a shared visit with non-physician practitioner(s) and myself.  I personally evaluated the patient during the encounter.   EKG Interpretation None     Constant positional right flank pain but also rlq pain with mild tenderness rlq abd and right paralumbar/flank  Hurman Horn, MD 01/14/14 1342

## 2014-01-03 NOTE — Discharge Instructions (Signed)
Please call your doctor for a followup appointment within 24-48 hours. When you talk to your doctor please let them know that you were seen in the emergency department and have them acquire all of your records so that they can discuss the findings with you and formulate a treatment plan to fully care for your new and ongoing problems. Please call and set-up an appointment with Dr. Lajoyce Corners Please rest and stay hydrated Please apply icy hot ointment and massage Please avoid heavy strenuous activity  Please continue to monitor symptoms closely and if symptoms are to worsen or change (fever greater than 101, chills, sweating, nausea, vomiting, chest pain, shortness of breathe, difficulty breathing, weakness, numbness, tingling, worsening or changes to pain pattern, fall, injury, inability to control urine or bowel movements, inability to keep any food or fluids down, looked in the stools, black tarry stools) please report back to the Emergency Department immediately.    Abdominal Pain, Women Abdominal (stomach, pelvic, or belly) pain can be caused by many things. It is important to tell your doctor:  The location of the pain.  Does it come and go or is it present all the time?  Are there things that start the pain (eating certain foods, exercise)?  Are there other symptoms associated with the pain (fever, nausea, vomiting, diarrhea)? All of this is helpful to know when trying to find the cause of the pain. CAUSES   Stomach: virus or bacteria infection, or ulcer.  Intestine: appendicitis (inflamed appendix), regional ileitis (Crohn's disease), ulcerative colitis (inflamed colon), irritable bowel syndrome, diverticulitis (inflamed diverticulum of the colon), or cancer of the stomach or intestine.  Gallbladder disease or stones in the gallbladder.  Kidney disease, kidney stones, or infection.  Pancreas infection or cancer.  Fibromyalgia (pain disorder).  Diseases of the female  organs:  Uterus: fibroid (non-cancerous) tumors or infection.  Fallopian tubes: infection or tubal pregnancy.  Ovary: cysts or tumors.  Pelvic adhesions (scar tissue).  Endometriosis (uterus lining tissue growing in the pelvis and on the pelvic organs).  Pelvic congestion syndrome (female organs filling up with blood just before the menstrual period).  Pain with the menstrual period.  Pain with ovulation (producing an egg).  Pain with an IUD (intrauterine device, birth control) in the uterus.  Cancer of the female organs.  Functional pain (pain not caused by a disease, may improve without treatment).  Psychological pain.  Depression. DIAGNOSIS  Your doctor will decide the seriousness of your pain by doing an examination.  Blood tests.  X-rays.  Ultrasound.  CT scan (computed tomography, special type of X-ray).  MRI (magnetic resonance imaging).  Cultures, for infection.  Barium enema (dye inserted in the large intestine, to better view it with X-rays).  Colonoscopy (looking in intestine with a lighted tube).  Laparoscopy (minor surgery, looking in abdomen with a lighted tube).  Major abdominal exploratory surgery (looking in abdomen with a large incision). TREATMENT  The treatment will depend on the cause of the pain.   Many cases can be observed and treated at home.  Over-the-counter medicines recommended by your caregiver.  Prescription medicine.  Antibiotics, for infection.  Birth control pills, for painful periods or for ovulation pain.  Hormone treatment, for endometriosis.  Nerve blocking injections.  Physical therapy.  Antidepressants.  Counseling with a psychologist or psychiatrist.  Minor or major surgery. HOME CARE INSTRUCTIONS   Do not take laxatives, unless directed by your caregiver.  Take over-the-counter pain medicine only if ordered by your caregiver.  Do not take aspirin because it can cause an upset stomach or  bleeding.  Try a clear liquid diet (broth or water) as ordered by your caregiver. Slowly move to a bland diet, as tolerated, if the pain is related to the stomach or intestine.  Have a thermometer and take your temperature several times a day, and record it.  Bed rest and sleep, if it helps the pain.  Avoid sexual intercourse, if it causes pain.  Avoid stressful situations.  Keep your follow-up appointments and tests, as your caregiver orders.  If the pain does not go away with medicine or surgery, you may try:  Acupuncture.  Relaxation exercises (yoga, meditation).  Group therapy.  Counseling. SEEK MEDICAL CARE IF:   You notice certain foods cause stomach pain.  Your home care treatment is not helping your pain.  You need stronger pain medicine.  You want your IUD removed.  You feel faint or lightheaded.  You develop nausea and vomiting.  You develop a rash.  You are having side effects or an allergy to your medicine. SEEK IMMEDIATE MEDICAL CARE IF:   Your pain does not go away or gets worse.  You have a fever.  Your pain is felt only in portions of the abdomen. The right side could possibly be appendicitis. The left lower portion of the abdomen could be colitis or diverticulitis.  You are passing blood in your stools (bright red or black tarry stools, with or without vomiting).  You have blood in your urine.  You develop chills, with or without a fever.  You pass out. MAKE SURE YOU:   Understand these instructions.  Will watch your condition.  Will get help right away if you are not doing well or get worse. Document Released: 01/29/2007 Document Revised: 08/18/2013 Document Reviewed: 02/18/2009 [facility] Patient Information 2015 [place], [place]. This information is not intended to replace advice given to you by your health care provider. Make sure you discuss any questions you have with your health care provider. Back Pain, Adult Back pain is very  common. The pain often gets better over time. The cause of back pain is usually not dangerous. Most people can learn to manage their back pain on their own.  HOME CARE   Stay active. Start with short walks on flat ground if you can. Try to walk farther each day.  Do not sit, drive, or stand in one place for more than 30 minutes. Do not stay in bed.  Do not avoid exercise or work. Activity can help your back heal faster.  Be careful when you bend or lift an object. Bend at your knees, keep the object close to you, and do not twist.  Sleep on a firm mattress. Lie on your side, and bend your knees. If you lie on your back, put a pillow under your knees.  Only take medicines as told by your doctor.  Put ice on the injured area.  Put ice in a plastic bag.  Place a towel between your skin and the bag.  Leave the ice on for 15-20 minutes, 03-04 times a day for the first 2 to 3 days. After that, you can switch between ice and heat packs.  Ask your doctor about back exercises or massage.  Avoid feeling anxious or stressed. Find good ways to deal with stress, such as exercise. GET HELP RIGHT AWAY IF:   Your pain does not go away with rest or medicine.  Your pain does not go  away in 1 week.  You have new problems.  You do not feel well.  The pain spreads into your legs.  You cannot control when you poop (bowel movement) or pee (urinate).  Your arms or legs feel weak or lose feeling (numbness).  You feel sick to your stomach (nauseous) or throw up (vomit).  You have belly (abdominal) pain.  You feel like you may pass out (faint). MAKE SURE YOU:   Understand these instructions.  Will watch your condition.  Will get help right away if you are not doing well or get worse. Document Released: 09/20/2007 Document Revised: 06/26/2011 Document Reviewed: 08/05/2013 [facility] Patient Information 2015 [place], [place]. This information is not intended to replace advice given to you by  your health care provider. Make sure you discuss any questions you have with your health care provider.

## 2014-01-03 NOTE — ED Provider Notes (Signed)
CSN: [id number]     Arrival date & time 01/03/14  0825 History   None    Chief Complaint  Patient presents with  . Back Pain     (Consider location/radiation/quality/duration/timing/severity/associated sxs/prior Treatment) The history is provided by the patient. No language interpreter was used.  Brooke [name] is a 58 year old female with no known significant past medical history presenting to the ED with back pain that started approximately 3 days ago localized to the right side. Reported that it is a constant pulling sensation worse with motion, but states that she continues to feel the discomfort even at rest. Reported the discomfort radiates down into her right hip and groin. Stated that she's been using ibuprofen with minimal relief. Denied recent fall, injury, nausea, vomiting, diarrhea, fever, chills, numbness, tingling, neck pain, difficulty breathing, chest pain, urinary and bowel incontinence, urinary symptoms, nausea, vomiting. PCP Dr. Ninetta Lights  History reviewed. No pertinent past medical history. History reviewed. No pertinent past surgical history. History reviewed. No pertinent family history. History  Substance Use Topics  . Smoking status: Not on file  . Smokeless tobacco: Not on file  . Alcohol Use: No   OB History   Grav Para Term Preterm Abortions TAB SAB Ect Mult Living                 Review of Systems  Constitutional: Negative for fever and chills.  Respiratory: Negative for chest tightness and shortness of breath.   Cardiovascular: Negative for chest pain.  Gastrointestinal: Negative for nausea, vomiting, abdominal pain and diarrhea.  Genitourinary: Negative for dysuria and decreased urine volume.  Musculoskeletal: Positive for back pain. Negative for neck pain.  Neurological: Negative for weakness and numbness.      Allergies  Review of patient's allergies indicates no known allergies.  Home Medications   Prior to Admission medications     Medication Sig Start Date End Date Taking? Authorizing Provider  bisacodyl (DULCOLAX) 5 MG EC tablet Take 10 mg by mouth daily as needed for moderate constipation.   Yes Historical Provider, MD  ibuprofen (ADVIL,MOTRIN) 200 MG tablet Take 400 mg by mouth every 6 (six) hours as needed for moderate pain.   Yes Historical Provider, MD  ibuprofen (ADVIL,MOTRIN) 400 MG tablet Take 1 tablet (400 mg total) by mouth every 6 (six) hours as needed. 01/03/14   Georgeann Brinkman, PA-C   BP 124/74  Pulse 77  Temp(Src) 97.6 F (36.4 C) (Oral)  Resp 18  SpO2 94% Physical Exam  Nursing note and vitals reviewed. Constitutional: She is oriented to person, place, and time. She appears well-developed and well-nourished. No distress.  HENT:  Head: Normocephalic and atraumatic.  Mouth/Throat: Oropharynx is clear and moist. No oropharyngeal exudate.  Eyes: Conjunctivae and EOM are normal. Pupils are equal, round, and reactive to light. Right eye exhibits no discharge. Left eye exhibits no discharge.  Neck: Normal range of motion. Neck supple. No tracheal deviation present.  Cardiovascular: Normal rate, regular rhythm and normal heart sounds.   Pulmonary/Chest: Effort normal. No respiratory distress. She has no wheezes. She has no rales.  Abdominal: Soft. Bowel sounds are normal. She exhibits no distension. There is tenderness. There is no rebound and no guarding.    Negative abdominal distention Bowel sounds normal active in all 4 quadrants Abdomen soft upon palpation Discomfort upon palpation to the right lower quadrant and right side of the abdomen Positive CVA tenderness to the right flank Negative peritoneal signs Negative rigidity or guarding noted  Musculoskeletal: Normal range of motion. She exhibits no tenderness.  Negative deformities identified to the spine. Negative pain upon palpation to the mid lumbar spine. Mild discomfort upon palpation to the right paravertebral region, thoracic and  lumbar.  Full ROM to upper and lower extremities without difficulty noted, negative ataxia noted.  Lymphadenopathy:    She has no cervical adenopathy.  Neurological: She is alert and oriented to person, place, and time. No cranial nerve deficit. She exhibits normal muscle tone. Coordination normal.  Cranial nerves III-XII grossly intact Strength 5+/5+ to upper and lower extremities bilaterally with resistance applied, equal distribution noted Equal grip strength bilaterally Negative facial droop Negative slurred speech Negative aphasia Patient is able to follow commands well Patient response to questions appropriately Patient stable to bring finger to nose bilaterally without difficulty or ataxia Sensation intact with differentiation to sharp and dull touch Negative arm drift Fine motor skills intact Gait proper, proper balance - negative sway, negative drift, negative step-offs  Skin: Skin is warm and dry. No rash noted. She is not diaphoretic. No erythema.  Psychiatric: She has a normal mood and affect. Her behavior is normal. Thought content normal.    ED Course  Procedures (including critical care time)  10:07 AM Patient seen and assessed by attending physician, Dr. Jinger Neighbors who recommended patient to have CT abdomen and pelvis with contrast performed.   Results for orders placed during the hospital encounter of 01/03/14  URINALYSIS, ROUTINE W REFLEX MICROSCOPIC      Result Value Ref Range   Color, Urine YELLOW  YELLOW   APPearance CLEAR  CLEAR   Specific Gravity, Urine 1.018  1.005 - 1.030   pH 5.5  5.0 - 8.0   Glucose, UA NEGATIVE  NEGATIVE mg/dL   Hgb urine dipstick NEGATIVE  NEGATIVE   Bilirubin Urine NEGATIVE  NEGATIVE   Ketones, ur NEGATIVE  NEGATIVE mg/dL   Protein, ur NEGATIVE  NEGATIVE mg/dL   Urobilinogen, UA 0.2  0.0 - 1.0 mg/dL   Nitrite NEGATIVE  NEGATIVE   Leukocytes, UA NEGATIVE  NEGATIVE  CBC WITH DIFFERENTIAL      Result Value Ref Range   WBC 6.6   4.0 - 10.5 K/uL   RBC 5.06  3.87 - 5.11 MIL/uL   Hemoglobin 14.2  12.0 - 15.0 g/dL   HCT 16.1  09.6 - 04.5 %   MCV 82.0  78.0 - 100.0 fL   MCH 28.1  26.0 - 34.0 pg   MCHC 34.2  30.0 - 36.0 g/dL   RDW 40.9  81.1 - 91.4 %   Platelets 247  150 - 400 K/uL   Neutrophils Relative % 56  43 - 77 %   Neutro Abs 3.7  1.7 - 7.7 K/uL   Lymphocytes Relative 33  12 - 46 %   Lymphs Abs 2.1  0.7 - 4.0 K/uL   Monocytes Relative 7  3 - 12 %   Monocytes Absolute 0.4  0.1 - 1.0 K/uL   Eosinophils Relative 3  0 - 5 %   Eosinophils Absolute 0.2  0.0 - 0.7 K/uL   Basophils Relative 1  0 - 1 %   Basophils Absolute 0.0  0.0 - 0.1 K/uL  COMPREHENSIVE METABOLIC PANEL      Result Value Ref Range   Sodium 142  137 - 147 mEq/L   Potassium 4.2  3.7 - 5.3 mEq/L   Chloride 102  96 - 112 mEq/L   CO2 27  19 - 32  mEq/L   Glucose, Bld 106 (*) 70 - 99 mg/dL   BUN 18  6 - 23 mg/dL   Creatinine, Ser 0.98  0.50 - 1.10 mg/dL   Calcium 9.5  8.4 - 11.9 mg/dL   Total Protein 7.6  6.0 - 8.3 g/dL   Albumin 3.9  3.5 - 5.2 g/dL   AST 24  0 - 37 U/L   ALT 44 (*) 0 - 35 U/L   Alkaline Phosphatase 91  39 - 117 U/L   Total Bilirubin 0.3  0.3 - 1.2 mg/dL   GFR calc non Af Amer >90  >90 mL/min   GFR calc Af Amer >90  >90 mL/min   Anion gap 13  5 - 15  LIPASE, BLOOD      Result Value Ref Range   Lipase 51  11 - 59 U/L  PREGNANCY, URINE      Result Value Ref Range   Preg Test, Ur NEGATIVE  NEGATIVE   Labs Review Labs Reviewed  COMPREHENSIVE METABOLIC PANEL - Abnormal; Notable for the following:    Glucose, Bld 106 (*)    ALT 44 (*)    All other components within normal limits  URINALYSIS, ROUTINE W REFLEX MICROSCOPIC  CBC WITH DIFFERENTIAL  LIPASE, BLOOD  PREGNANCY, URINE    Imaging Review Dg Chest 2 View  01/03/2014   CLINICAL DATA:  Low back pain. Since pleurisy following lifting injury ; productive cough  EXAM: CHEST  2 VIEW  COMPARISON:  PA and lateral chest x-ray of May 16, 2013  FINDINGS: The lungs are  well-expanded and clear. The heart and pulmonary vascularity are normal. The mediastinum is normal in width. There is no pleural effusion. The bony thorax is unremarkable.  IMPRESSION: There is no acute cardiopulmonary abnormality.   Electronically Signed   By: David  [place]   On: 01/03/2014 10:20   Ct Abdomen Pelvis W Contrast  01/03/2014   CLINICAL DATA:  Right lower quadrant abdominal pain, right flank pain and back pain which the patient relates to heavy lifting at work. No discrete injury.  EXAM: CT ABDOMEN AND PELVIS WITH CONTRAST  TECHNIQUE: Multidetector CT imaging of the abdomen and pelvis was performed using the standard protocol following bolus administration of intravenous contrast.  CONTRAST:  OMNIPAQUE IOHEXOL 300 MG/ML IV. Oral contrast was also administered.  COMPARISON:  05/28/2013.  FINDINGS: Wall thickening involving the cecum and proximal ascending colon is felt to be due to the fact that this segment is relatively collapsed rather than true inflammation, as there is no hyperemia or pericolonic inflammation. Mobile cecum positioned in the right mid abdomen. Remainder of the colon normal in appearance. Normal appendix in the right mid abdomen, filling with the oral contrast. Normal appearing stomach and small bowel. No ascites.  Normal appearing liver, spleen, pancreas, adrenal glands, kidneys, and gallbladder. No biliary ductal dilation. Calcified lymph node in the porta hepatis. No significant lymphadenopathy. No visible aortoiliofemoral atherosclerosis.  Mildly enlarged uterus containing fibroids. Left ovarian varicocele. No adnexal masses or free pelvic fluid. Urinary bladder unremarkable.  Bone window images unremarkable apart from mild lower thoracic spondylosis. Visualized lung bases clear apart from a solitary focus of tree-in-bud opacity in the left lower lobe. Heart size normal.  IMPRESSION: 1. No acute abnormalities involving the abdomen or pelvis. Wall thickening involving the  cecum and proximal ascending colon is felt to be due to the fact these segments are collapsed rather than true colitis as there is no associated edema/inflammation  or hyperemia. 2. Uterine fibroids. 3. Left ovarian varicocele. 4. Solitary focus of tree-in-bud opacity in the left lower lobe, likely inflammatory. Please correlate with symptoms such as cough.   Electronically Signed   By: Hulan Saas M.D.   On: 01/03/2014 12:41     EKG Interpretation None      MDM   Final diagnoses:  Right-sided back pain, unspecified location  Right sided abdominal pain    Medications  iohexol (OMNIPAQUE) 300 MG/ML solution 25 mL (25 mLs Oral Contrast Given 01/03/14 1021)  fentaNYL (SUBLIMAZE) injection 50 mcg (50 mcg Intravenous Given 01/03/14 1114)  iohexol (OMNIPAQUE) 300 MG/ML solution 100 mL (100 mLs Intravenous Contrast Given 01/03/14 1144)   Filed Vitals:   01/03/14 1230 01/03/14 1245 01/03/14 1300 01/03/14 1351  BP: 129/72 129/76 131/80 124/74  Pulse:    77  Temp:      TempSrc:      Resp: SpO2:        CBC unremarkable. CMP mildly elevated ALT of 44. Glucose 106. Negative anion gap-13.0 mEq per liter. Lipase negative elevation. Urinalysis unremarkable. Urine pregnancy negative. Chest x-ray negative for acute cardiac pulmonary disease. CT abdomen and pelvis with contrast negative for acute abnormalities. Wall thickening involving the cecum and proximal descending colon are due to be segments collapsing rather than colitis. Solitary focus of tree in blood opacity in the left lower lobe, likely inflammatory. Doubt appendicitis. Doubt pancreatitis. Doubt pyelonephritis. Doubt stone or renal colic. Doubt cauda equina. Negative findings of ischemia. Negative findings for acute abdominal processes in the CT abdomen and pelvis with contrast. Patient seen and assessed by attending physician, Dr. Jinger Neighbors - labs and imaging reviewed - agreed to plan of discharge. Suspicion to be muscular  pain secondary to pain with motion and pain upon palpation - lumbar strain. Patient stable, afebrile. Patient not septic appearing. Negative focal neurological deficits noted. Discharged patient. Referred to PCP and orthopedics. Discussed with patient to rest and stay hydrated. Discussed with patient to closely monitor symptoms and if symptoms are to worsen or change to report back to the ED - strict return instructions given.  Patient agreed to plan of care, understood, all questions answered.    Raymon Mutton, PA-C 01/03/14 1749

## 2014-01-03 NOTE — ED Notes (Signed)
Moved all Pt personal items to room A-9. Pt took purse with her to X-Ray dept. Ed staff did not have any contact with Purse.

## 2014-01-03 NOTE — ED Notes (Signed)
Pt reports doing lifting at work and having pain to right lower back since Thursday. Pain increases with movement and denies any urinary symptoms.

## 2014-01-16 ENCOUNTER — Other Ambulatory Visit: Payer: Self-pay | Admitting: Obstetrics & Gynecology

## 2014-01-16 ENCOUNTER — Other Ambulatory Visit (HOSPITAL_COMMUNITY)
Admission: RE | Admit: 2014-01-16 | Discharge: 2014-01-16 | Disposition: A | Discharge status: Home / Self Care | Payer: BC Managed Care – PPO | Source: Ambulatory Visit | Attending: Obstetrics & Gynecology | Admitting: Obstetrics & Gynecology

## 2014-01-16 DIAGNOSIS — Z01419 Encounter for gynecological examination (general) (routine) without abnormal findings: Secondary | ICD-10-CM | POA: Insufficient documentation

## 2014-01-19 LAB — CYTOLOGY - PAP

## 2014-02-04 ENCOUNTER — Ambulatory Visit: Payer: BC Managed Care – PPO | Attending: Family Medicine | Admitting: Physical Therapy

## 2014-02-04 DIAGNOSIS — M62830 Muscle spasm of back: Secondary | ICD-10-CM | POA: Insufficient documentation

## 2014-02-04 DIAGNOSIS — M545 Low back pain: Secondary | ICD-10-CM | POA: Insufficient documentation

## 2014-02-04 DIAGNOSIS — Z5189 Encounter for other specified aftercare: Secondary | ICD-10-CM | POA: Diagnosis not present

## 2014-02-04 DIAGNOSIS — M62838 Other muscle spasm: Secondary | ICD-10-CM | POA: Insufficient documentation

## 2014-02-11 ENCOUNTER — Ambulatory Visit: Payer: BC Managed Care – PPO | Admitting: Physical Therapy

## 2014-02-17 ENCOUNTER — Ambulatory Visit: Payer: BC Managed Care – PPO | Admitting: Physical Therapy

## 2014-02-18 ENCOUNTER — Encounter: Payer: BC Managed Care – PPO | Admitting: Physical Therapy

## 2014-03-03 ENCOUNTER — Ambulatory Visit: Payer: BC Managed Care – PPO | Admitting: Physical Therapy

## 2014-03-04 ENCOUNTER — Ambulatory Visit: Payer: BC Managed Care – PPO | Admitting: Physical Therapy

## 2014-03-10 ENCOUNTER — Encounter: Payer: BC Managed Care – PPO | Admitting: Physical Therapy

## 2014-03-18 ENCOUNTER — Encounter: Payer: BC Managed Care – PPO | Admitting: Physical Therapy

## 2014-10-20 ENCOUNTER — Other Ambulatory Visit (HOSPITAL_COMMUNITY): Payer: Self-pay | Admitting: Family Medicine

## 2014-10-20 DIAGNOSIS — Z1231 Encounter for screening mammogram for malignant neoplasm of breast: Secondary | ICD-10-CM

## 2014-10-26 ENCOUNTER — Ambulatory Visit (HOSPITAL_COMMUNITY)
Admission: RE | Admit: 2014-10-26 | Discharge: 2014-10-26 | Disposition: A | Discharge status: Home / Self Care | Payer: BLUE CROSS/BLUE SHIELD | Source: Ambulatory Visit | Attending: Family Medicine | Admitting: Family Medicine

## 2014-10-26 ENCOUNTER — Other Ambulatory Visit (HOSPITAL_COMMUNITY): Payer: Self-pay | Admitting: Family Medicine

## 2014-10-26 DIAGNOSIS — Z1231 Encounter for screening mammogram for malignant neoplasm of breast: Secondary | ICD-10-CM | POA: Diagnosis present

## 2014-11-04 ENCOUNTER — Ambulatory Visit (HOSPITAL_COMMUNITY): Payer: Self-pay

## 2015-09-04 IMAGING — CR DG CHEST 2V
2 series · 2 of 2 positions shown · non-contrast
Comparison: PA and lateral chest x-ray May 16, 2013

CLINICAL DATA: Low back pain. Since pleurisy following lifting
injury ; productive cough

EXAM:
CHEST  2 VIEW

[w chest pa *]
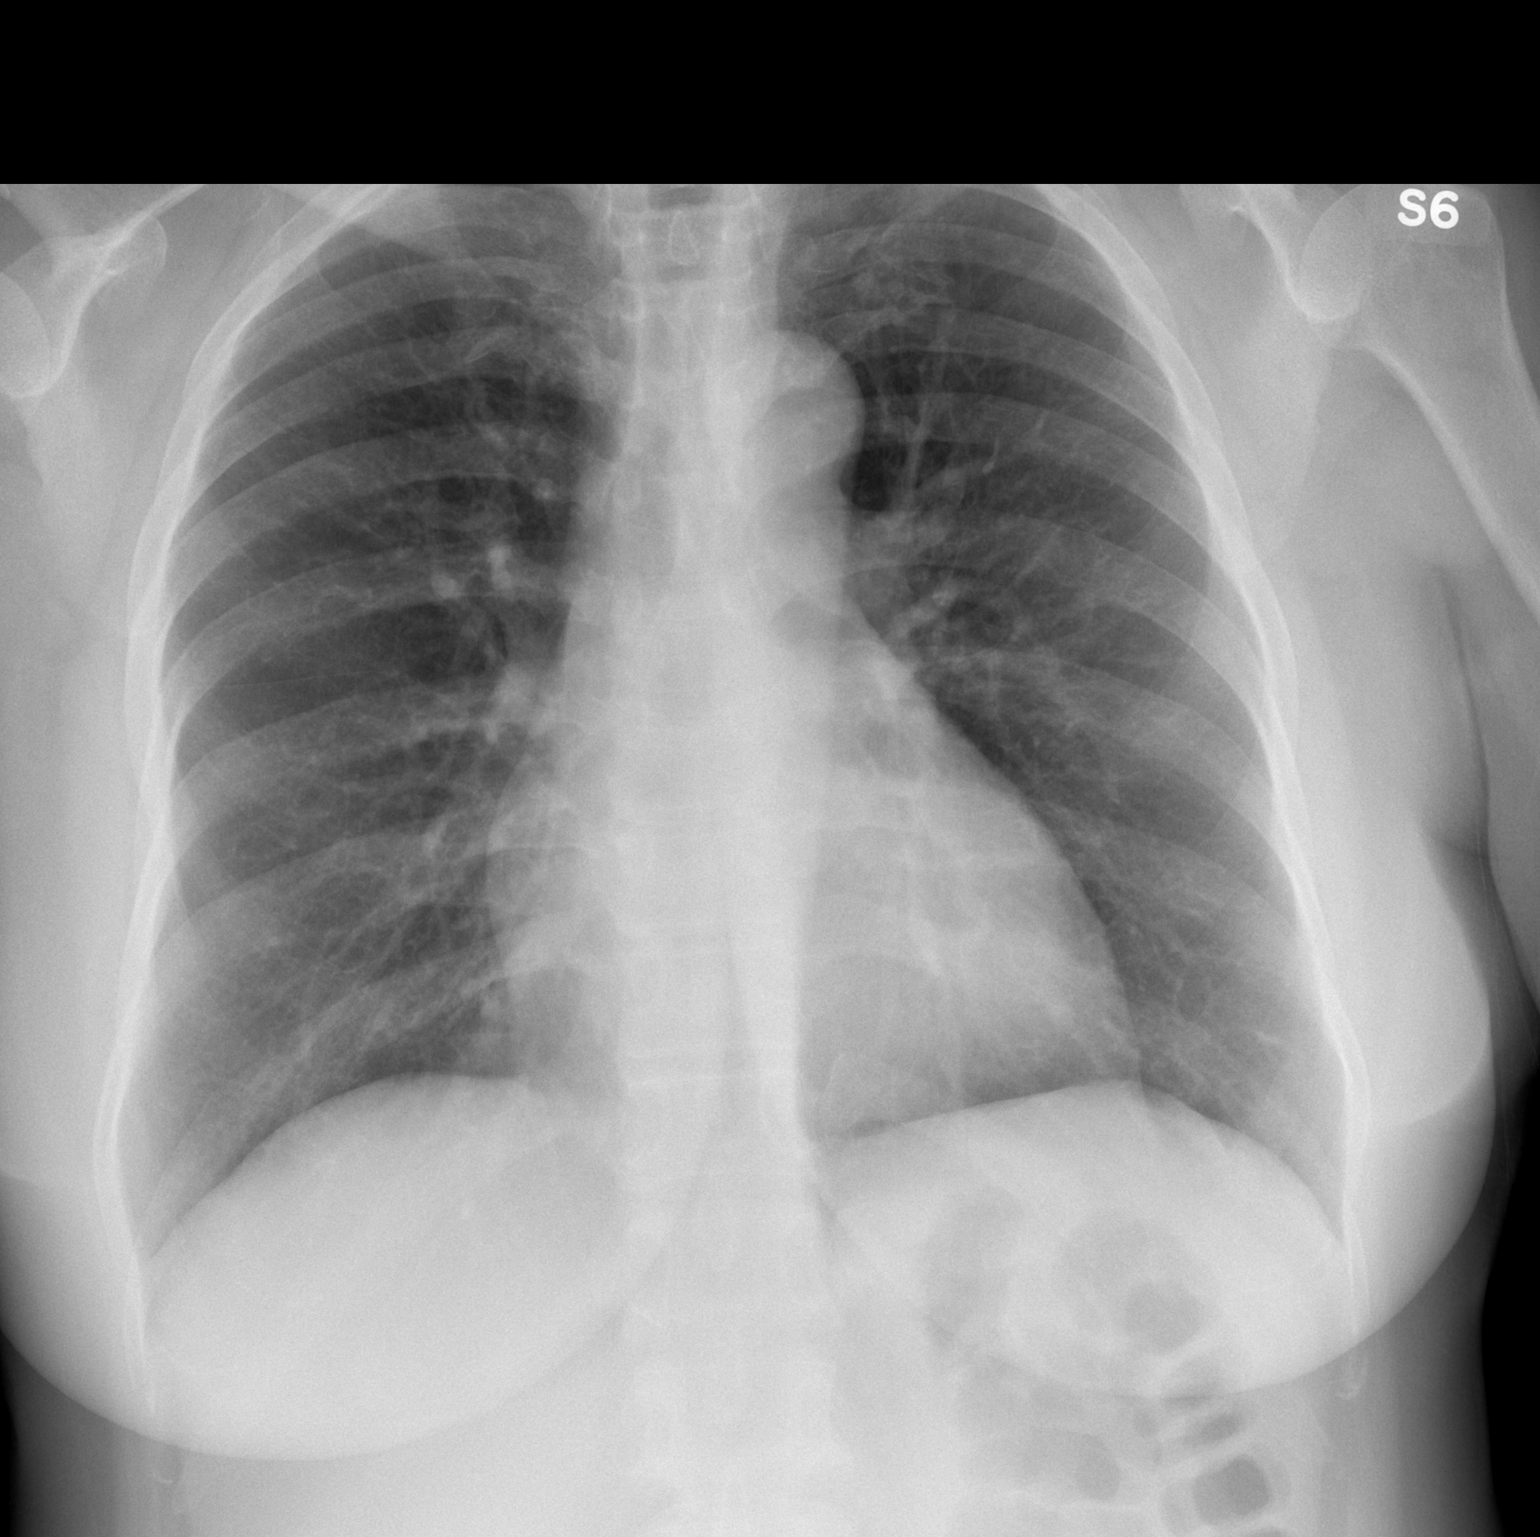

[w chest lat]
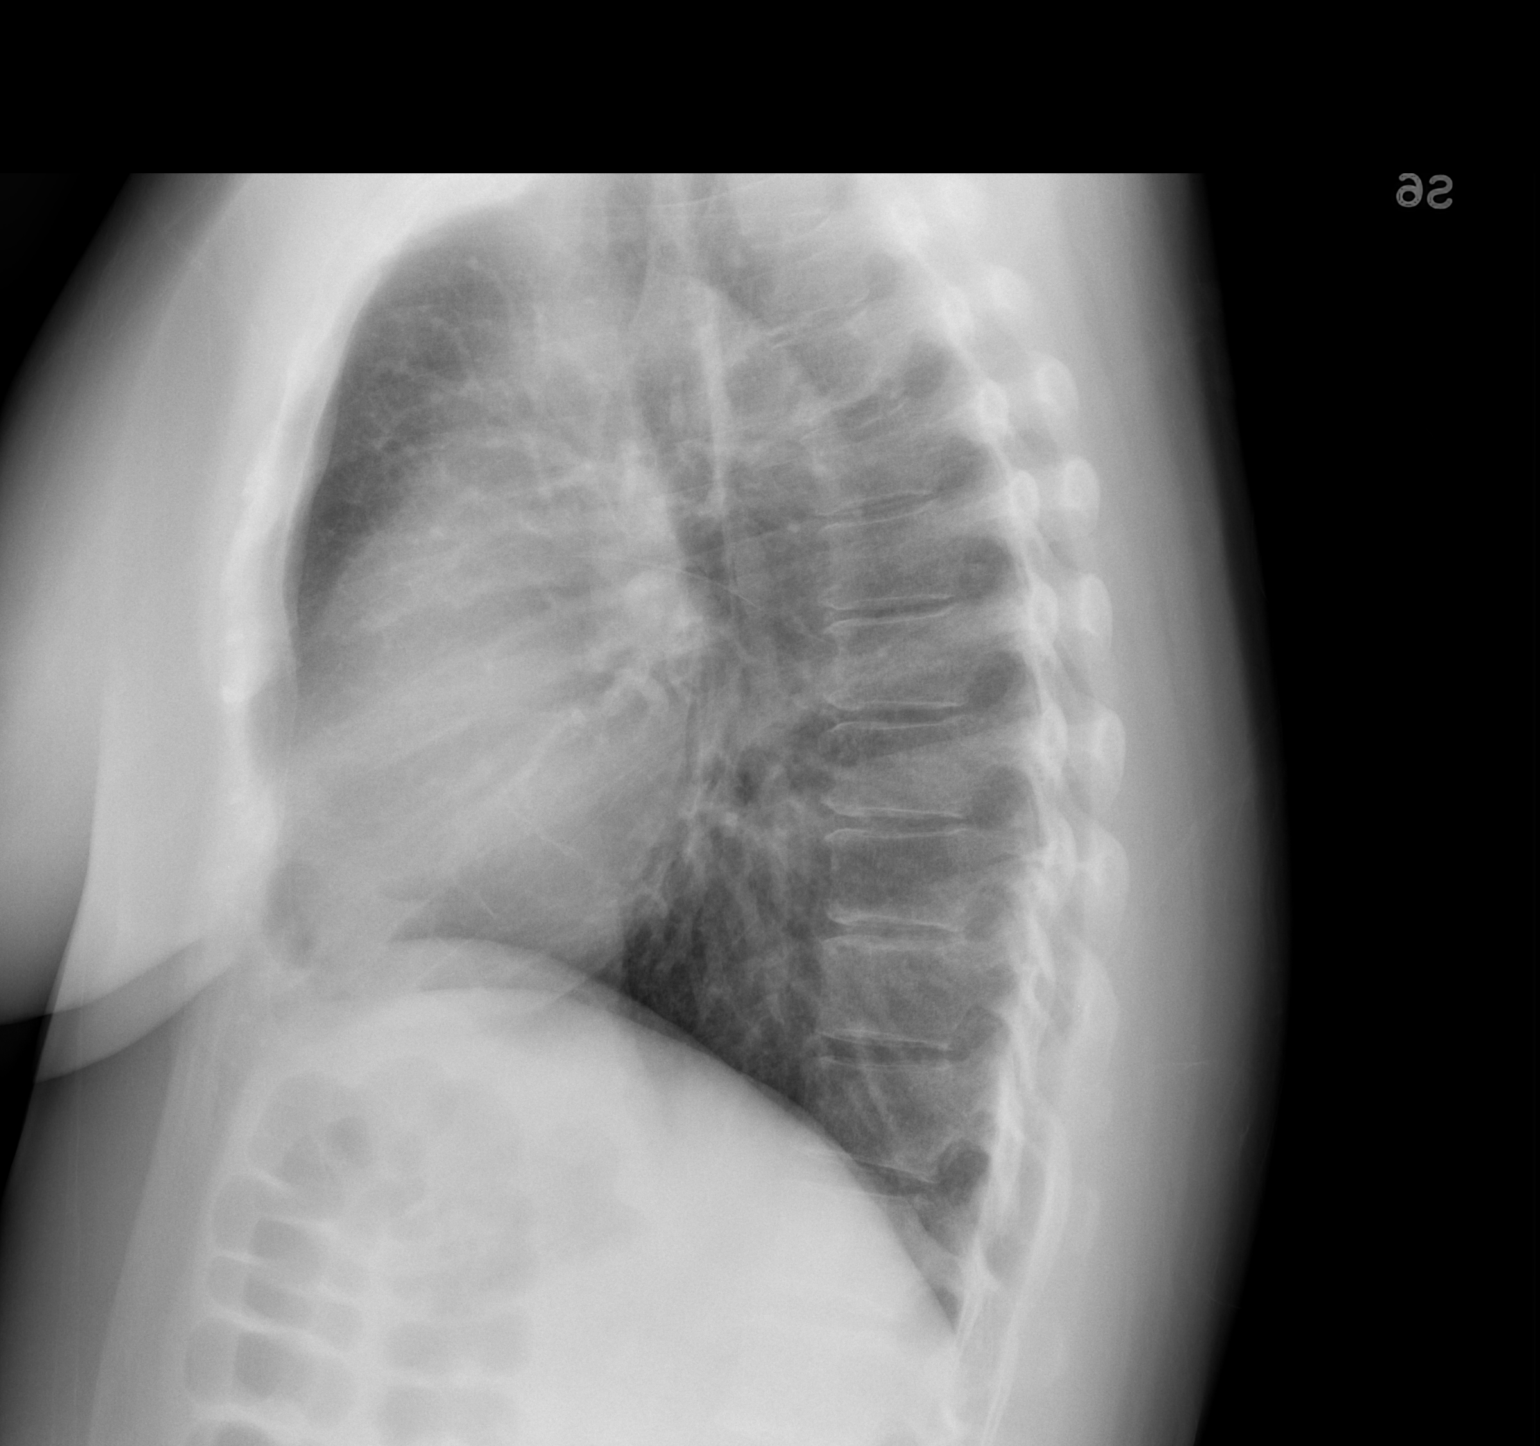

[2 of 2 positions shown; findings below may reference images not displayed]

FINDINGS: The lungs are well-expanded and clear. The heart and pulmonary
vascularity are normal. The mediastinum is normal in width. There is
no pleural effusion. The bony thorax is unremarkable.
IMPRESSION: There is no acute cardiopulmonary abnormality.

## 2015-09-04 IMAGING — CT CT ABD-PELV W/ CM
2 of 5 series · 13 of 46 positions shown, 15 images · IV contrast (omnipaque)
Comparison: 05/28/2013.

CLINICAL DATA: Right lower quadrant abdominal pain, right flank
pain and back pain which the patient relates to heavy lifting at
work. No discrete injury.

EXAM:
CT ABDOMEN AND PELVIS WITH CONTRAST
TECHNIQUE: Multidetector CT imaging of the abdomen and pelvis was performed
using the standard protocol following bolus administration of
intravenous contrast.
CONTRAST:  100mL OMNIPAQUE IOHEXOL 300 MG/ML IV. Oral contrast was
also administered.

[Series 201: routine, idose (2) · axial · 0.71mm/px · z∈[-433,-28]mm · 10 of 93 slices shown, 12 images]
[im 6/93  soft-tissue]
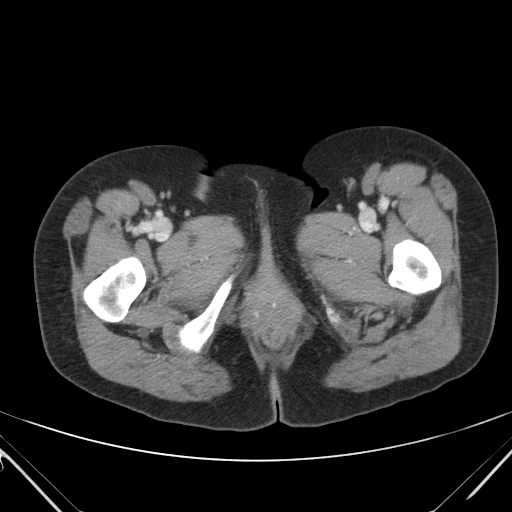
[im 6/93  bone]
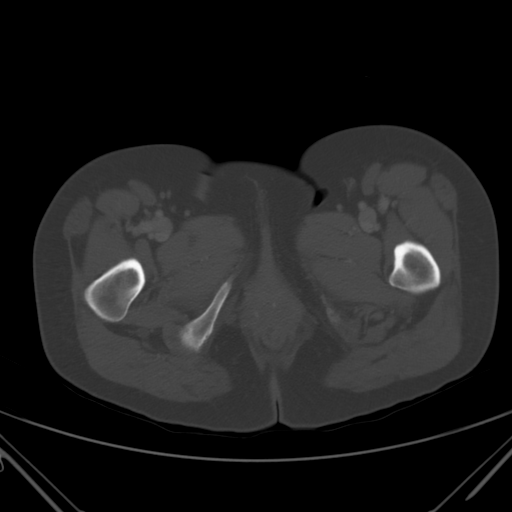
[im 16/93  soft-tissue]
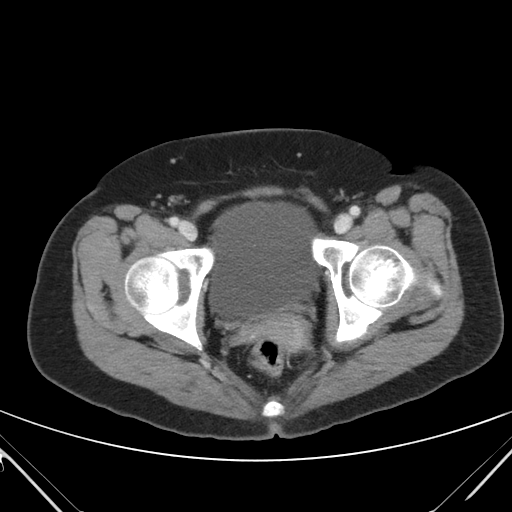
[im 26/93  soft-tissue]
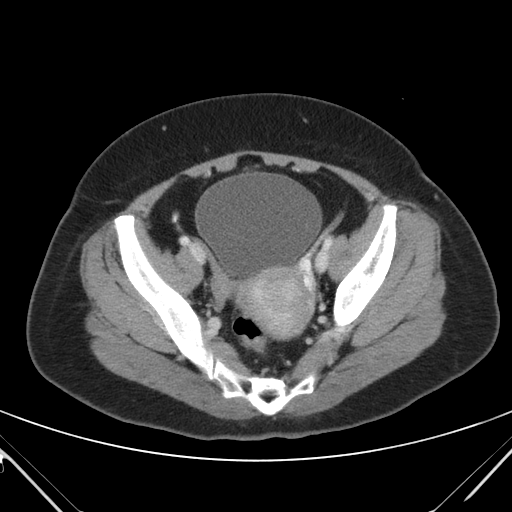
[im 31/93  soft-tissue]
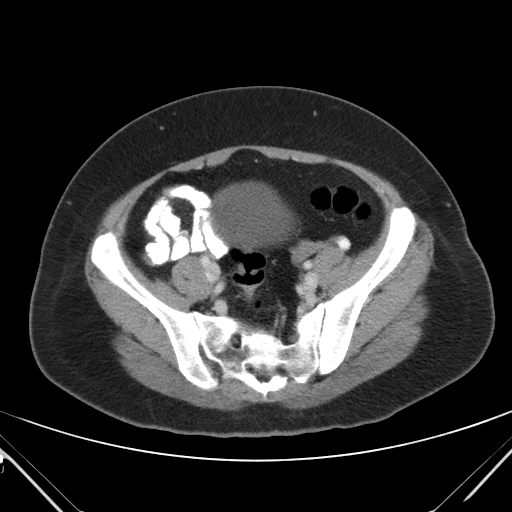
[im 41/93  soft-tissue]
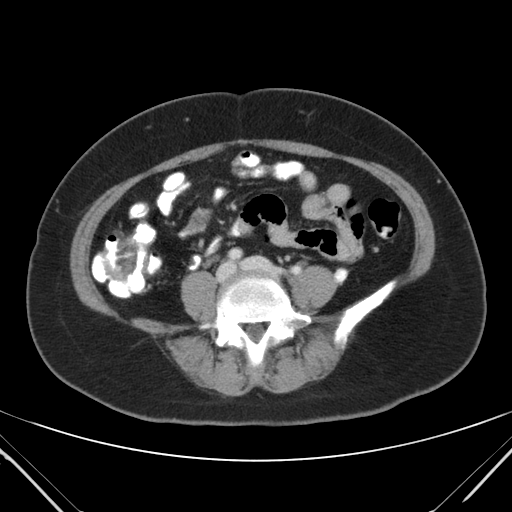
[im 52/93  soft-tissue]
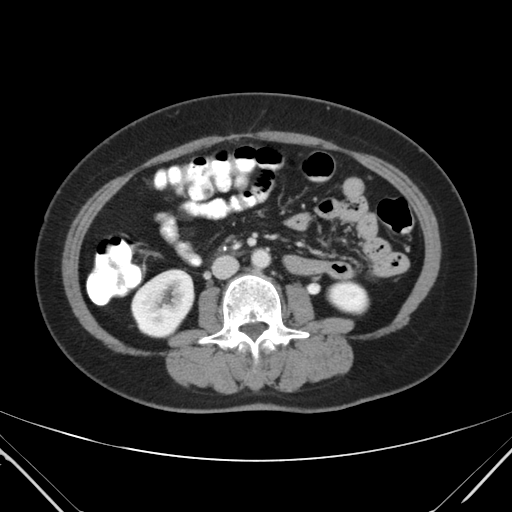
[im 62/93  soft-tissue]
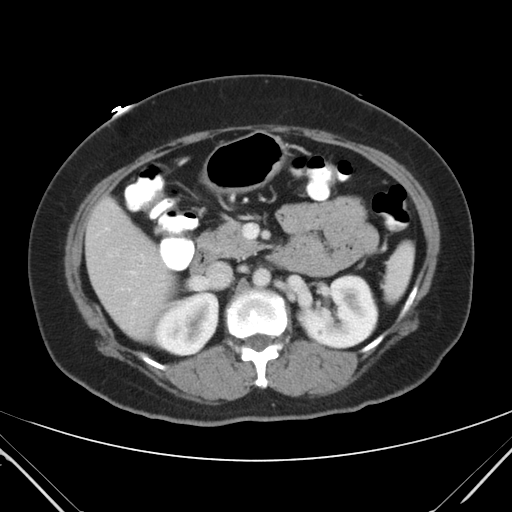
[im 67/93  soft-tissue]
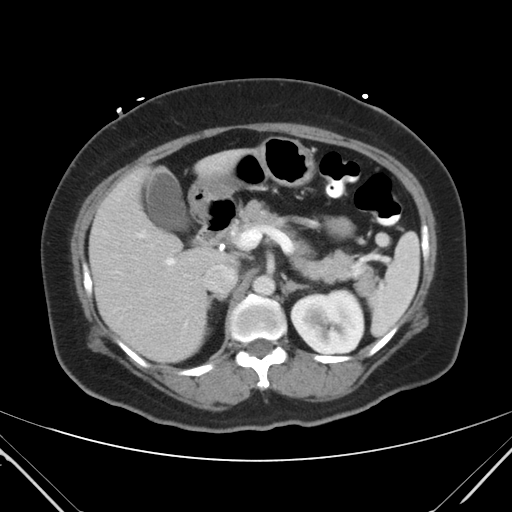
[im 77/93  soft-tissue]
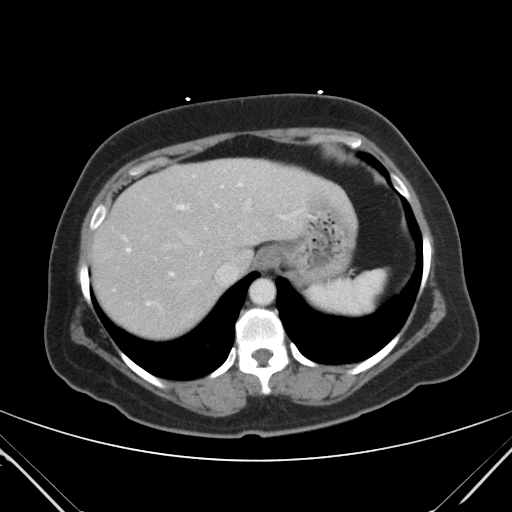
[im 77/93  bone]
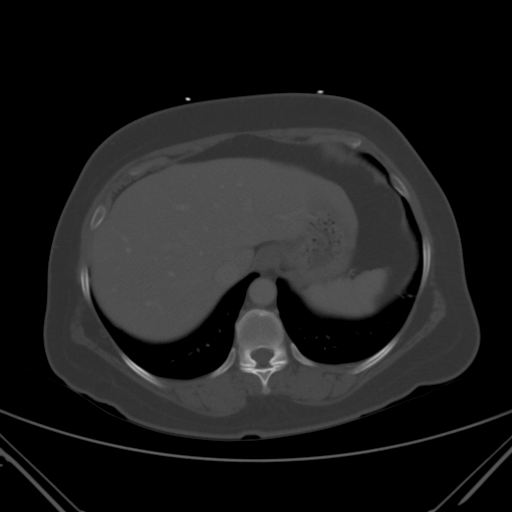
[im 87/93  soft-tissue]
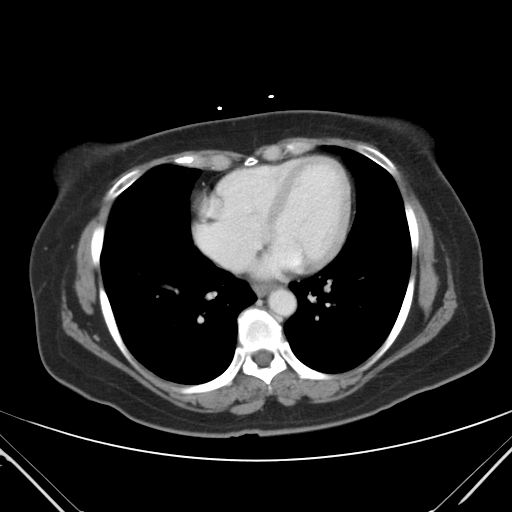

[Series 203: coronals, idose (2) · coronal · 0.50mm/px · 3 of 110 slices shown]
[im 37/110  soft-tissue]
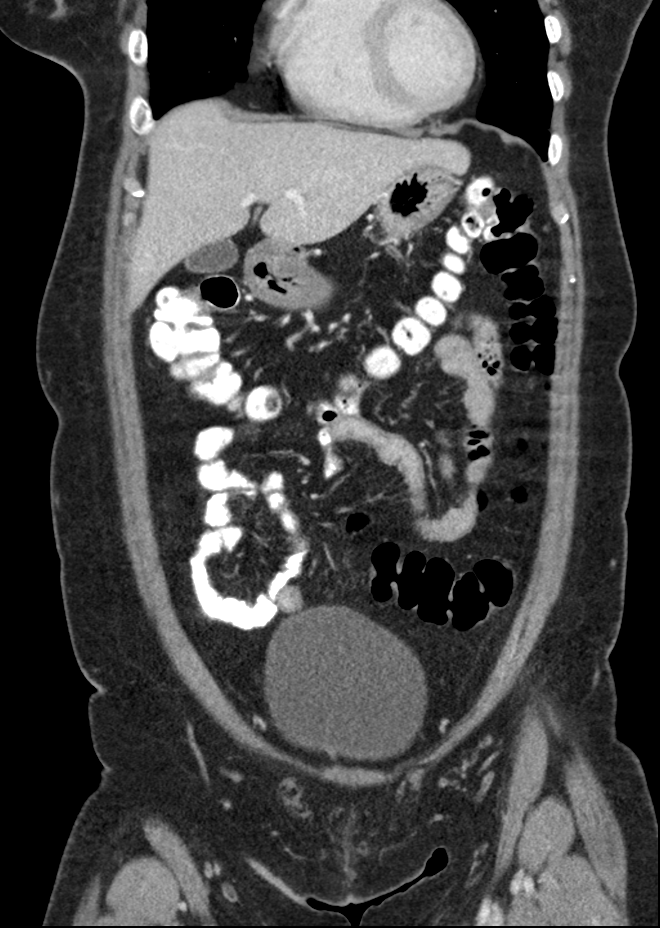
[im 49/110  soft-tissue]
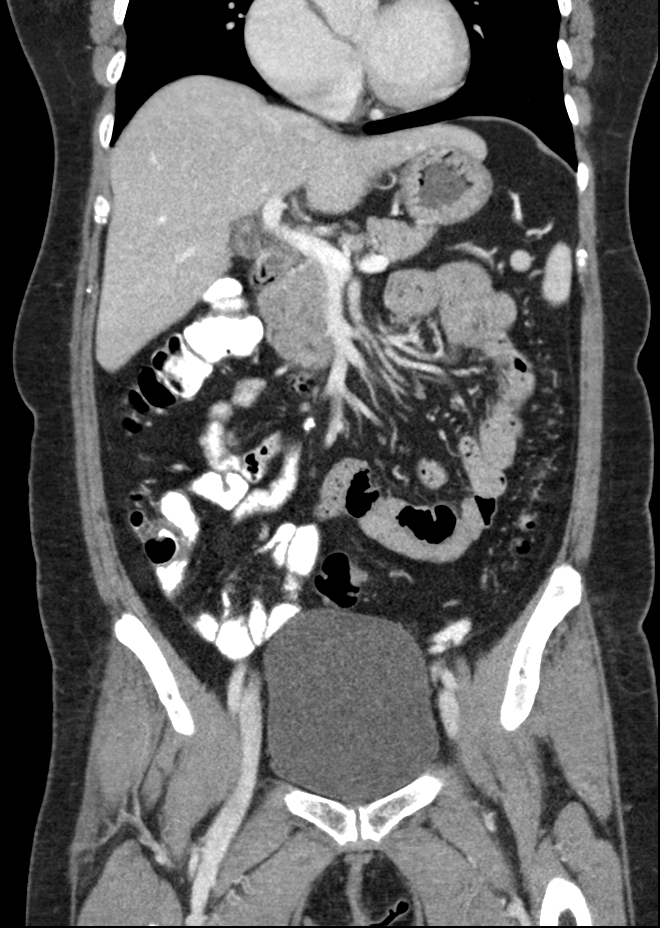
[im 61/110  soft-tissue]
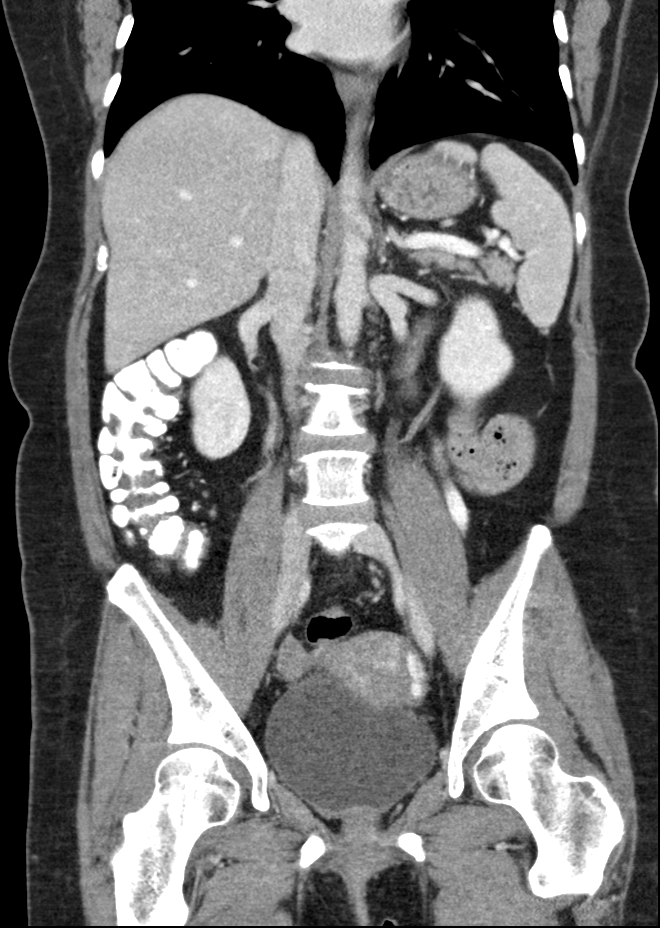

[13 of 46 positions shown; findings below may reference images not displayed]

FINDINGS: Wall thickening involving the cecum and proximal ascending colon is
felt to be due to the fact that this segment is relatively collapsed
rather than true inflammation, as there is no hyperemia or
pericolonic inflammation. Mobile cecum positioned in the right mid
abdomen. Remainder of the colon normal in appearance. Normal
appendix in the right mid abdomen, filling with the oral contrast.
Normal appearing stomach and small bowel. No ascites.

Normal appearing liver, spleen, pancreas, adrenal glands, kidneys,
and gallbladder. No biliary ductal dilation. Calcified lymph node in
the porta hepatis. No significant lymphadenopathy. No visible
aortoiliofemoral atherosclerosis.

Mildly enlarged uterus containing fibroids. Left ovarian varicocele.
No adnexal masses or free pelvic fluid. Urinary bladder
unremarkable.

Bone window images unremarkable apart from mild lower thoracic
spondylosis. Visualized lung bases clear apart from a solitary focus
of tree-in-bud opacity in the left lower lobe. Heart size normal.
IMPRESSION: 1. No acute abnormalities involving the abdomen or pelvis. Wall
thickening involving the cecum and proximal ascending colon is felt
to be due to the fact these segments are collapsed rather than true
colitis as there is no associated edema/inflammation or hyperemia.
2. Uterine fibroids.
3. Left ovarian varicocele.
4. Solitary focus of tree-in-bud opacity in the left lower lobe,
likely inflammatory. Please correlate with symptoms such as cough.

## 2016-03-01 ENCOUNTER — Other Ambulatory Visit: Payer: Self-pay | Admitting: Family Medicine

## 2016-03-01 ENCOUNTER — Ambulatory Visit
Admission: RE | Admit: 2016-03-01 | Discharge: 2016-03-01 | Disposition: A | Discharge status: Home / Self Care | Payer: BLUE CROSS/BLUE SHIELD | Source: Ambulatory Visit | Attending: Family Medicine | Admitting: Family Medicine

## 2016-03-01 DIAGNOSIS — R0602 Shortness of breath: Secondary | ICD-10-CM

## 2016-12-21 ENCOUNTER — Other Ambulatory Visit: Payer: Self-pay | Admitting: Family Medicine

## 2016-12-21 DIAGNOSIS — Z1231 Encounter for screening mammogram for malignant neoplasm of breast: Secondary | ICD-10-CM

## 2017-01-09 ENCOUNTER — Ambulatory Visit
Admission: RE | Admit: 2017-01-09 | Discharge: 2017-01-09 | Disposition: A | Discharge status: Home / Self Care | Payer: BLUE CROSS/BLUE SHIELD | Source: Ambulatory Visit | Attending: Family Medicine | Admitting: Family Medicine

## 2017-01-09 DIAGNOSIS — Z1231 Encounter for screening mammogram for malignant neoplasm of breast: Secondary | ICD-10-CM

## 2017-07-17 ENCOUNTER — Emergency Department (HOSPITAL_COMMUNITY)
Admission: EM | Admit: 2017-07-17 | Discharge: 2017-07-18 | Disposition: A | Discharge status: Home / Self Care | Payer: BLUE CROSS/BLUE SHIELD | Attending: Physician Assistant | Admitting: Physician Assistant

## 2017-07-17 ENCOUNTER — Encounter (HOSPITAL_COMMUNITY): Payer: Self-pay | Admitting: Emergency Medicine

## 2017-07-17 ENCOUNTER — Emergency Department (HOSPITAL_COMMUNITY): Payer: BLUE CROSS/BLUE SHIELD

## 2017-07-17 DIAGNOSIS — R079 Chest pain, unspecified: Secondary | ICD-10-CM | POA: Insufficient documentation

## 2017-07-17 DIAGNOSIS — R0602 Shortness of breath: Secondary | ICD-10-CM | POA: Insufficient documentation

## 2017-07-17 LAB — CBC
HEMATOCRIT: 43.4 % (ref 36.0–46.0)
Hemoglobin: 14.4 g/dL (ref 12.0–15.0)
MCH: 28 pg (ref 26.0–34.0)
MCHC: 33.2 g/dL (ref 30.0–36.0)
MCV: 84.4 fL (ref 78.0–100.0)
PLATELETS: 278 10*3/uL (ref 150–400)
RBC: 5.14 MIL/uL — AB (ref 3.87–5.11)
RDW: 12.8 % (ref 11.5–15.5)
WBC: 7.6 10*3/uL (ref 4.0–10.5)

## 2017-07-17 LAB — I-STAT TROPONIN, ED
TROPONIN I, POC: 0.01 ng/mL (ref 0.00–0.08)
Troponin i, poc: 0 ng/mL (ref 0.00–0.08)

## 2017-07-17 LAB — BASIC METABOLIC PANEL
Anion gap: 8 (ref 5–15)
BUN: 19 mg/dL (ref 6–20)
CHLORIDE: 105 mmol/L (ref 101–111)
CO2: 26 mmol/L (ref 22–32)
Calcium: 9.2 mg/dL (ref 8.9–10.3)
Creatinine, Ser: 0.61 mg/dL (ref 0.44–1.00)
GFR calc Af Amer: >60 mL/min (ref >60)
GFR calc non Af Amer: >60 mL/min (ref >60)
Glucose, Bld: 120 mg/dL — ABNORMAL HIGH (ref 65–99)
Potassium: 3.5 mmol/L (ref 3.5–5.1)
Sodium: 139 mmol/L (ref 135–145)

## 2017-07-17 LAB — I-STAT BETA HCG BLOOD, ED (MC, WL, AP ONLY): I-stat hCG, quantitative: <5 m[IU]/mL (ref <5)

## 2017-07-17 LAB — D-DIMER, QUANTITATIVE: D-Dimer, Quant: <0.27 ug/mL-FEU (ref 0.00–0.50)

## 2017-07-17 NOTE — ED Triage Notes (Signed)
Pt reports yesterday started having chest pains on left side while at work that would come and go. reports that she set down and pains went away. Today still having intermittent chest pains but not as severe.  Reports that when she has cough she has sputum.

## 2017-07-17 NOTE — Discharge Instructions (Addendum)
We are unsure what is causing your chest pain.  We are reassured that it is not a blood clot in your lungs nor is it a heart attack.  Please follow-up with your primary care.  Please record symptoms.  It could be a little virus that is causing a little pain in your lung.  You may take ibuprofen Tylenol please return with any increasing shortness of breath, worsening pain, or other concerns.

## 2017-07-17 NOTE — ED Provider Notes (Signed)
[facility] DEPT Provider Note   CSN: [id number] Arrival date & time: 07/17/17  1545     History   Chief Complaint Chief Complaint  Patient presents with  . Chest Pain  . Cough    HPI SAMELLA LUCCHETTI is a 62 y.o. female.  HPI   Patient is a 62 year old female with no real significant past medical history.  She has no diabetes no hyperlipidemia or hypertension.  She is presenting with a needlelike pain in the left chest.  She reports it started last night at 7 PM.  She says that she has had a minor cough.  And with a small amounts of shortness of breath.  No diaphoresis.  Chest pain did not migrate.  No recent travel.  No swelling to lower extremiti4esw  History reviewed. No pertinent past medical history.  Patient Active Problem List   Diagnosis Date Noted  . 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 DEPRESSION 08/10/2008  . LYMPHADENOPATHY 08/10/2008    Past Surgical History:  Procedure Laterality Date  . NECK SURGERY       OB History   None      Home Medications    Prior to Admission medications   Medication Sig Start Date End Date Taking? Authorizing Provider  bisacodyl (DULCOLAX) 5 MG EC tablet Take 10 mg by mouth daily as needed for moderate constipation.   Yes [provider]  cetirizine (ZYRTEC) 10 MG tablet Take 10 mg by mouth daily.   Yes [provider]  ibuprofen (ADVIL,MOTRIN) 400 MG tablet Take 1 tablet (400 mg total) by mouth every 6 (six) hours as needed. 01/03/14  Yes [place], Marissa, PA-C    Family History No family history on file.  Social History Social History   Tobacco Use  . Smoking status: Never Smoker  . Smokeless tobacco: Never Used  Substance Use Topics  . Alcohol use: No  . Drug use: No     Allergies   Patient has no known allergies.   Review of Systems Review of Systems  Constitutional: Negative for activity change.  Respiratory: Positive for shortness of breath.     Cardiovascular: Positive for chest pain.  Gastrointestinal: Negative for abdominal pain.  All other systems reviewed and are negative.    Physical Exam Updated Vital Signs BP 129/78   Pulse 68   Temp 97.7 F (36.5 C) (Oral)   Resp (!) 22   SpO2 100%   Physical Exam  Constitutional: She is oriented to person, place, and time. She appears well-developed and well-nourished.  HENT:  Head: Normocephalic and atraumatic.  Eyes: EOM are normal. Right eye exhibits no discharge.  Cardiovascular: Normal rate, regular rhythm, intact distal pulses and normal pulses.  Pulmonary/Chest: Effort normal and breath sounds normal. No accessory muscle usage. No respiratory distress.  Musculoskeletal:       Right lower leg: She exhibits no edema.       Left lower leg: She exhibits no edema.  Neurological: She is oriented to person, place, and time.  Skin: Skin is warm and dry. She is not diaphoretic.  Psychiatric: She has a normal mood and affect.  Nursing note and vitals reviewed.    ED Treatments / Results  Labs (all labs ordered are listed, but only abnormal results are displayed) Labs Reviewed  BASIC METABOLIC PANEL - Abnormal; Notable for the following components:      Result Value   Glucose, Bld 120 (*)    All other components within  normal limits  CBC - Abnormal; Notable for the following components:   RBC 5.14 (*)    All other components within normal limits  D-DIMER, QUANTITATIVE (NOT AT [facility])  I-STAT TROPONIN, ED  I-STAT BETA HCG BLOOD, ED ([facility], [facility], [facility] ONLY)  I-STAT TROPONIN, ED    EKG EKG Interpretation  Date/Time:  Tuesday July 17 2017 16:03:30 EDT Ventricular Rate:  73 PR Interval:    QRS Duration: 86 QT Interval:  387 QTC Calculation: 427 R Axis:   52 Text Interpretation:  Sinus rhythm Borderline repolarization abnormality Baseline wander in lead(s) I II aVR aVL aVF Normal sinus rhythm Confirmed by Bary CastillaMackuen, Clydine Parkison ([id number]) on 07/17/2017 7:58:35  PM   Radiology Dg Chest 2 View  Result Date: 07/17/2017 CLINICAL DATA:  Chest pain EXAM: CHEST - 2 VIEW COMPARISON:  March 01, 2016 FINDINGS: Lungs are clear. Heart size and pulmonary vascularity are normal. No adenopathy. No pneumothorax. No bone lesions. IMPRESSION: No edema or consolidation. Electronically Signed   By: Bretta BangWilliam  Woodruff III M.D.   On: 07/17/2017 16:23    Procedures Procedures (including critical care time)  Medications Ordered in ED Medications - No data to display   Initial Impression / Assessment and Plan / ED Course  I have reviewed the triage vital signs and the nursing notes.  Pertinent labs & imaging results that were available during my care of the patient were reviewed by me and considered in my medical decision making (see chart for details).     Patient is a 62 year old female with no real significant past medical history.  She has no diabetes no hyperlipidemia or hypertension.  She is presenting with a needlelike pain in the left chest.  She reports it started last night at 7 PM.  She says that she has had a minor cough.  And with a small amounts of shortness of breath.  No diaphoresis.  Chest pain did not migrate.  No recent travel.  No swelling to lower extremiti4esw  8:42 PM Patient very well-appearing.  Does not PERC out because of age.  Given the shortness of breath, will add d-dimer.  Patient has a low heart score, will do delta troponin  Final Clinical Impressions(s) / ED Diagnoses   Final diagnoses:  None    ED Discharge Orders    None       Abelino DerrickMackuen, Peightyn Roberson Lyn, MD 07/17/17 2312

## 2017-07-17 NOTE — ED Notes (Signed)
Bed: WA09 Expected date:  Expected time:  Means of arrival:  Comments: 

## 2017-07-27 ENCOUNTER — Other Ambulatory Visit (HOSPITAL_COMMUNITY): Payer: Self-pay | Admitting: Family Medicine

## 2017-07-27 ENCOUNTER — Other Ambulatory Visit: Payer: Self-pay | Admitting: Family Medicine

## 2017-07-27 DIAGNOSIS — R079 Chest pain, unspecified: Secondary | ICD-10-CM

## 2017-08-02 ENCOUNTER — Telehealth (HOSPITAL_COMMUNITY): Payer: Self-pay | Admitting: *Deleted

## 2017-08-02 NOTE — Telephone Encounter (Signed)
Patient given detailed instructions per Stress Test Requisition Sheet for test on 08/09/17 at 7:30.Patient Notified to arrive 30 minutes early, and that it is imperative to arrive on time for appointment to keep from having the test rescheduled.  Patient verbalized understanding. Daneil DolinSharon S Brooks

## 2017-08-09 ENCOUNTER — Ambulatory Visit (HOSPITAL_COMMUNITY): Payer: BLUE CROSS/BLUE SHIELD

## 2017-08-09 ENCOUNTER — Other Ambulatory Visit: Payer: Self-pay

## 2017-08-09 ENCOUNTER — Ambulatory Visit (HOSPITAL_COMMUNITY): Payer: BLUE CROSS/BLUE SHIELD | Attending: Cardiology

## 2017-08-09 DIAGNOSIS — R079 Chest pain, unspecified: Secondary | ICD-10-CM | POA: Diagnosis not present

## 2017-08-24 ENCOUNTER — Other Ambulatory Visit: Payer: Self-pay | Admitting: Family Medicine

## 2017-08-24 DIAGNOSIS — Z1231 Encounter for screening mammogram for malignant neoplasm of breast: Secondary | ICD-10-CM

## 2018-01-11 ENCOUNTER — Ambulatory Visit
Admission: RE | Admit: 2018-01-11 | Discharge: 2018-01-11 | Disposition: A | Discharge status: Home / Self Care | Payer: BLUE CROSS/BLUE SHIELD | Source: Ambulatory Visit | Attending: Family Medicine | Admitting: Family Medicine

## 2018-01-11 DIAGNOSIS — Z1231 Encounter for screening mammogram for malignant neoplasm of breast: Secondary | ICD-10-CM

## 2018-06-10 ENCOUNTER — Other Ambulatory Visit: Payer: Self-pay

## 2018-06-10 ENCOUNTER — Ambulatory Visit: Payer: BLUE CROSS/BLUE SHIELD | Attending: Family Medicine | Admitting: Physical Therapy

## 2018-06-10 ENCOUNTER — Encounter: Payer: Self-pay | Admitting: Physical Therapy

## 2018-06-10 DIAGNOSIS — M25512 Pain in left shoulder: Secondary | ICD-10-CM | POA: Diagnosis present

## 2018-06-10 DIAGNOSIS — M25612 Stiffness of left shoulder, not elsewhere classified: Secondary | ICD-10-CM | POA: Diagnosis present

## 2018-06-10 DIAGNOSIS — M6281 Muscle weakness (generalized): Secondary | ICD-10-CM | POA: Diagnosis present

## 2018-06-10 NOTE — Therapy (Signed)
[facility] [address] [place], [place], [address] Phone: [phone]   Fax:  [phone]  Physical Therapy Treatment  Patient Details  Name: Brooke [name] MRN: [id number] Date of Birth: 1955/09/13 Referring Provider (PT): Mila Palmer, MD   Encounter Date: 06/10/2018  PT End of Session - 06/10/18 1233    Visit Number  1    Date for PT Re-Evaluation  08/05/18    PT Start Time  1233    PT Stop Time  1312    PT Time Calculation (min)  39 min    Activity Tolerance  Patient tolerated treatment well       History reviewed. No pertinent past medical history.  Past Surgical History:  Procedure Laterality Date  . BREAST BIOPSY    . NECK SURGERY      There were no vitals filed for this visit.  Subjective Assessment - 06/10/18 1236    Subjective  Pt has been having pain and stiffness in left shoulder.  States she has had pain since having the flu shot a few months ago.     Limitations  House hold activities;Lifting    Patient Stated Goals  be able to reach with left arm    Currently in Pain?  Yes    Pain Score  7     Pain Location  Shoulder    Pain Orientation  Left    Pain Descriptors / Indicators  Aching;Sore    Pain Type  Acute pain    Pain Radiating Towards  shoulder and up into upper trap    Pain Onset  More than a month ago   3 months   Pain Frequency  Intermittent    Aggravating Factors   reaching    Pain Relieving Factors  taking medicine but doesn't help the pain, helps me sleep; not moving and holding the arm    Effect of Pain on Daily Activities  reaching and overhead activities         [facility] PT Assessment - 06/10/18 0001      Assessment   Medical Diagnosis  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    Referring Provider (PT)  Mila Palmer, MD    Onset Date/Surgical Date  02/11/18    Hand Dominance  Right    Prior Therapy  No      Precautions   Precautions   None      Balance Screen   Has the patient fallen in the past 6 months  No      Home Environment   Living Environment  Private residence    Living Arrangements  Children   daughter; widowed     Prior Function   Level of Independence  Independent    Vocation  Part time employment    Architect      Cognition   Overall Cognitive Status  Within Functional Limits for tasks assessed      Observation/Other Assessments   Focus on Therapeutic Outcomes (FOTO)   50% limited   goal 32%     Posture/Postural Control   Posture/Postural Control  Postural limitations    Postural Limitations  Rounded Shoulders      ROM / Strength   AROM / PROM / Strength  AROM;Strength;PROM      AROM   AROM Assessment Site  Shoulder    Right/Left Shoulder  Right;Left    Right Shoulder Flexion  160 Degrees  Right Shoulder ABduction  165 Degrees    Left Shoulder Flexion  95 Degrees    Left Shoulder ABduction  68 Degrees      PROM   PROM Assessment Site  Shoulder    Right/Left Shoulder  Right;Left    Right Shoulder Flexion  --   appx 100 deg   Right Shoulder External Rotation  --   appx 30     Strength   Strength Assessment Site  Shoulder    Right/Left Shoulder  Right;Left    Left Shoulder Flexion  3+/5    Left Shoulder ABduction  3+/5      Palpation   Palpation comment  muscle spasms deltoids and upper traps on left side      Special Tests    Special Tests  Rotator Cuff Impingement    Rotator Cuff Impingment tests  Leanord Asal test      Hawkins-Kennedy test   Findings  Negative                   [facility] Adult PT Treatment/Exercise - 06/10/18 0001      Self-Care   Self-Care  Other Self-Care Comments    Other Self-Care Comments   educated and performed initial HEP for home care              PT Education - 06/10/18 1311    Education Details   Access Code: [email or web address]     Person(s) Educated  Patient    Methods   Explanation;Demonstration;Tactile cues;Verbal cues;Handout    Comprehension  Verbalized understanding;Returned demonstration       PT Short Term Goals - 06/10/18 1511      PT SHORT TERM GOAL #1   Title  ind wtih initial HEP    Time  4    Period  Weeks    Status  New    Target Date  07/08/18      PT SHORT TERM GOAL #2   Title  Pt will demonstrate Lt shoulder flexion AROM at least 120 deg for improved functional reaching    Time  4    Period  Weeks    Status  New    Target Date  07/08/18        PT Long Term Goals - 06/10/18 1507      PT LONG TERM GOAL #1   Title  Pt will be able to lift Lt UE in order to put on deodorant under left arm.     Time  8    Period  Weeks    Status  New    Target Date  08/05/18      PT LONG TERM GOAL #2   Title  Pt will demonstrate Lt shoulder flexion AROM at least 140 deg for improved functional reaching    Time  8    Period  Weeks    Status  New    Target Date  08/05/18      PT LONG TERM GOAL #3   Time  8    Period  Weeks    Status  On-going    Target Date  08/05/18      PT LONG TERM GOAL #4   Title  Pt will report 30% less pain overall during typical day    Time  8    Period  Weeks    Status  New    Target Date  08/05/18      PT LONG TERM GOAL #5  Title  ind with advance HEP to advance herself with ROM and strength after discharge from PT    Time  8    Period  Weeks    Status  New    Target Date  08/05/18            Plan - 06/10/18 1502    Clinical Impression Statement  Patient is very nice 63 y/o woman who has had left shoulder pain and stiffness since late October 2019.  Pt has decreaed ROM both active and passive.  Pt has firm endfeel with GH joint hypmobility.  Pt has muscle spasms of lateral deltoid and upper trap on left side.  She demonstrates left shoulder weakness with increased difficulty using UE for all typical functional activities such as dressing and reaching overhead. Pt will benefit from skilled PT to  address impairments and return to maximum function.    Clinical Presentation  Stable    Clinical Presentation due to:  pt is stable    Clinical Decision Making  Low    Rehab Potential  Good    PT Frequency  1x / week    PT Duration  8 weeks    PT Treatment/Interventions  ADLs/Self Care Home Management;Cryotherapy;Electrical Stimulation;Iontophoresis 4mg /ml Dexamethasone;Moist Heat;Therapeutic activities;Therapeutic exercise;Neuromuscular re-education;Passive range of motion;Dry needling;Taping;Manual techniques    PT Next Visit Plan  P/AAROM, gentle strengthening, STM and taping    PT Home Exercise Plan   Access Code: [email or web address]     Consulted and Agree with Plan of Care  Patient       Patient will benefit from skilled therapeutic intervention in order to improve the following deficits and impairments:  Impaired perceived functional ability, Pain, Impaired UE functional use, Hypomobility, Decreased range of motion, Decreased strength, Increased muscle spasms  Visit Diagnosis: Stiffness of left shoulder, not elsewhere classified - Plan: PT plan of care cert/re-cert  Acute pain of left shoulder - Plan: PT plan of care cert/re-cert  Muscle weakness (generalized) - Plan: PT plan of care cert/re-cert     Problem List Patient Active Problem List   Diagnosis Date Noted  . 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 DEPRESSION 08/10/2008  . LYMPHADENOPATHY 08/10/2008    Vincente Poli, PT 06/10/2018, 3:20 PM  [facility] [address], [address] [place], [place], [address] Phone: [phone]   Fax:  [phone]  Name: Brooke [name] MRN: [id number] Date of Birth: [birth year]

## 2018-06-10 NOTE — Patient Instructions (Signed)
Access Code: [email or web address]  URL: [email or web address]  Date: 06/10/2018  Prepared by: Dorie Rank   Exercises  Seated Shoulder Flexion Towel Slide at Table Top - 5 reps - 1 sets - 10 sec hold - 3x daily - 7x weekly  Seated Shoulder External Rotation PROM on Table - 5 reps - 1 sets - 10 sec hold - 3x daily - 7x weekly  Standing Shoulder Flexion AAROM with Dowel - 10 reps - 3 sets - 1x daily - 7x weekly  Standing Shoulder Abduction AAROM with Dowel - 5 reps - 1 sets - 10 sec hold - 3x daily - 7x weekly

## 2018-06-17 ENCOUNTER — Encounter: Payer: Self-pay | Admitting: Physical Therapy

## 2018-06-17 ENCOUNTER — Ambulatory Visit: Payer: BLUE CROSS/BLUE SHIELD | Attending: Family Medicine | Admitting: Physical Therapy

## 2018-06-17 DIAGNOSIS — M25612 Stiffness of left shoulder, not elsewhere classified: Secondary | ICD-10-CM | POA: Insufficient documentation

## 2018-06-17 DIAGNOSIS — M6281 Muscle weakness (generalized): Secondary | ICD-10-CM | POA: Diagnosis present

## 2018-06-17 DIAGNOSIS — M25512 Pain in left shoulder: Secondary | ICD-10-CM | POA: Diagnosis present

## 2018-06-17 NOTE — Therapy (Addendum)
[facility] [address] [place], [place], [address] Phone: [phone]   Fax:  [phone]  Physical Therapy Treatment  Patient Details  Name: Brooke [name] MRN: [id number] Date of Birth: 08-Jun-1955 Referring Provider (PT): Jonathon Jordan, MD   Encounter Date: 06/17/2018  PT End of Session - 06/17/18 1057    Visit Number  2    Date for PT Re-Evaluation  08/05/18    PT Start Time  1058    PT Stop Time  1150    PT Time Calculation (min)  52 min    Activity Tolerance  Patient tolerated treatment well    Behavior During Therapy  [facility] for tasks assessed/performed       History reviewed. No pertinent past medical history.  Past Surgical History:  Procedure Laterality Date  . BREAST BIOPSY    . NECK SURGERY      There were no vitals filed for this visit.  Subjective Assessment - 06/17/18 1103    Subjective  My shoulder hurts and it burns in my arm.    Currently in Pain?  Yes    Pain Score  7     Pain Location  Shoulder    Pain Orientation  Left    Pain Descriptors / Indicators  Burning;Sore    Aggravating Factors   Raising arm up    Pain Relieving Factors  Medicine    Multiple Pain Sites  No                       [facility] Adult PT Treatment/Exercise - 06/17/18 0001      Self-Care   Other Self-Care Comments   Educated pt's daughter how to perform PROM for flexion and abduction to her mother's Lt shoulder . PTA demo, daughter correctly demo.      Shoulder Exercises: Seated   Flexion  AAROM;Both;10 reps      Shoulder Exercises: Standing   Flexion  AAROM;Both;10 reps    Theraband Level (Shoulder Flexion)  --   Cane review, and with abduction 10x    Shoulder Flexion Weight (lbs)  Finger ladder 10x #11       Shoulder Exercises: Pulleys   Flexion  3 minutes      Modalities   Modalities  Electrical Stimulation      Moist Heat Therapy   Number Minutes Moist Heat  10 Minutes    Moist Heat  Location  Shoulder      Electrical Stimulation   Electrical Stimulation Location  LT shoulder    Electrical Stimulation Action  IFC   Post session for post therapy pain   Electrical Stimulation Parameters  80-150 HZ    Electrical Stimulation Goals  Pain      Manual Therapy   Joint Mobilization  Gd 1, 2 ,3 posterior mobs for pain and motion. Distraction,     Soft tissue mobilization  Lt deltoid, upper arm    Passive ROM  Flexion 20x, abduciton & scaption 20x each    Pt very scared to move her arm, VC to relax and breathe              PT Short Term Goals - 06/10/18 1511      PT SHORT TERM GOAL #1   Title  ind wtih initial HEP    Time  4    Period  Weeks    Status  New    Target Date  07/08/18  PT SHORT TERM GOAL #2   Title  Pt will demonstrate Lt shoulder flexion AROM at least 120 deg for improved functional reaching    Time  4    Period  Weeks    Status  New    Target Date  07/08/18        PT Long Term Goals - 06/10/18 1507      PT LONG TERM GOAL #1   Title  Pt will be able to lift Lt UE in order to put on deodorant under left arm.     Time  8    Period  Weeks    Status  New    Target Date  08/05/18      PT LONG TERM GOAL #2   Title  Pt will demonstrate Lt shoulder flexion AROM at least 140 deg for improved functional reaching    Time  8    Period  Weeks    Status  New    Target Date  08/05/18      PT LONG TERM GOAL #3   Time  8    Period  Weeks    Status  On-going    Target Date  08/05/18      PT LONG TERM GOAL #4   Title  Pt will report 30% less pain overall during typical day    Time  8    Period  Weeks    Status  New    Target Date  08/05/18      PT LONG TERM GOAL #5   Title  ind with advance HEP to advance herself with ROM and strength after discharge from PT    Time  8    Period  Weeks    Status  New    Target Date  08/05/18            Plan - 06/17/18 1058    Clinical Impression Statement  Pt arrives with a sore  shoulder, buring in her arm and very scared to mover her LT arm. . Instructed daughter in PROM for flexion and abduction to do at home on a daily basis.  Pt is compliant with her initial HEP and added wall walking today. She needed constant redirection to her breathing duirng PROM and mobilizations. She eventually verbalized she was scared she was hurting her heart.  PTA assured pt this consition would not affect her heart but she would need to be dilligent in her home stretching.  Passive flexion was improved to 120 degrees at end of sesison compared to 100 degrees on the eval. Used modalities at end of session today as she was hurting after all the manual techniques.     Rehab Potential  Good    PT Frequency  1x / week    PT Duration  8 weeks    PT Treatment/Interventions  ADLs/Self Care Home Management;Cryotherapy;Electrical Stimulation;Iontophoresis 89m/ml Dexamethasone;Moist Heat;Therapeutic activities;Therapeutic exercise;Neuromuscular re-education;Passive range of motion;Dry needling;Taping;Manual techniques    PT Next Visit Plan  P/AAROM, see if ionto order signed, might be helpful, joint mobs to LT shoulder    PT Home Exercise Plan   Access Code: [email or web address]     Consulted and Agree with Plan of Care  Patient       Patient will benefit from skilled therapeutic intervention in order to improve the following deficits and impairments:  Impaired perceived functional ability, Pain, Impaired UE functional use, Hypomobility, Decreased range of motion, Decreased strength, Increased muscle spasms  Visit Diagnosis:  Stiffness of left shoulder, not elsewhere classified  Acute pain of left shoulder  Muscle weakness (generalized)     Problem List Patient Active Problem List   Diagnosis Date Noted  . 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 DEPRESSION 08/10/2008  . LYMPHADENOPATHY 08/10/2008    Koven Belinsky, PTA  06/17/2018, 11:53 AM  [facility] [address], [place], [place], [address] Phone: [phone]   Fax:  [phone]  Name: Brooke [name] MRN: [id number] Date of Birth: 10-11-1955  Access Code: [email or web address]  URL: [email or web address]  Date: 06/17/2018  Prepared by: Myrene Galas   Exercises  Seated Shoulder Flexion Towel Slide at Table Top - 5 reps - 1 sets - 10 sec hold - 3x daily - 7x weekly  Seated Shoulder External Rotation PROM on Table - 5 reps - 1 sets - 10 sec hold - 3x daily - 7x weekly  Standing Shoulder Flexion AAROM with Dowel - 10 reps - 3 sets - 1x daily - 7x weekly  Standing Shoulder Abduction AAROM with Dowel - 5 reps - 1 sets - 10 sec hold - 3x daily - 7x weekly  Shoulder Flexion Wall Walk - 10 reps - 1 sets - 5 hold - 3x daily - 7x weekly  Standing Shoulder Scaption Wall Walk - 10 reps - 1 sets - 5 hold - 3x daily - 7x weekly  PHYSICAL THERAPY DISCHARGE SUMMARY  Visits from Start of Care: 2  Current functional level related to goals / functional outcomes: See above details   Remaining deficits: See above   Education / Equipment: HEP Plan: Patient agrees to discharge.  Patient goals were not met. Patient is being discharged due to not returning since the last visit.  ?????     [employer], PT 08/24/18 1:37 PM

## 2018-06-24 ENCOUNTER — Encounter: Payer: BLUE CROSS/BLUE SHIELD | Admitting: Physical Therapy

## 2018-07-01 ENCOUNTER — Ambulatory Visit: Payer: BLUE CROSS/BLUE SHIELD | Admitting: Physical Therapy

## 2018-07-08 ENCOUNTER — Telehealth: Payer: Self-pay | Admitting: Physical Therapy

## 2018-07-08 ENCOUNTER — Encounter: Payer: BLUE CROSS/BLUE SHIELD | Admitting: Physical Therapy

## 2018-07-08 NOTE — Telephone Encounter (Signed)
PTA called pt for follow up after office closure due to the pandemic. Left message for pt to call our office/voice mail with information on clinic reopening. She would receive one additional call later in the week to address any questions she may have.   [employer], PTA @TODAY @ 2:49 PM

## 2018-07-15 ENCOUNTER — Encounter: Payer: BLUE CROSS/BLUE SHIELD | Admitting: Physical Therapy

## 2018-07-22 ENCOUNTER — Encounter: Payer: BLUE CROSS/BLUE SHIELD | Admitting: Physical Therapy

## 2018-07-29 ENCOUNTER — Encounter: Payer: BLUE CROSS/BLUE SHIELD | Admitting: Physical Therapy

## 2018-08-05 ENCOUNTER — Encounter: Payer: BLUE CROSS/BLUE SHIELD | Admitting: Physical Therapy

## 2018-12-27 ENCOUNTER — Other Ambulatory Visit: Payer: Self-pay | Admitting: Family Medicine

## 2018-12-27 DIAGNOSIS — Z1231 Encounter for screening mammogram for malignant neoplasm of breast: Secondary | ICD-10-CM

## 2019-02-17 ENCOUNTER — Other Ambulatory Visit: Payer: Self-pay

## 2019-02-17 ENCOUNTER — Ambulatory Visit
Admission: RE | Admit: 2019-02-17 | Discharge: 2019-02-17 | Disposition: A | Discharge status: Home / Self Care | Payer: BC Managed Care – PPO | Source: Ambulatory Visit | Attending: Family Medicine | Admitting: Family Medicine

## 2019-02-17 DIAGNOSIS — Z1231 Encounter for screening mammogram for malignant neoplasm of breast: Secondary | ICD-10-CM

## 2019-03-20 ENCOUNTER — Other Ambulatory Visit: Payer: Self-pay

## 2019-03-20 DIAGNOSIS — Q51820 Cervical duplication: Secondary | ICD-10-CM

## 2019-03-22 LAB — NOVEL CORONAVIRUS, NAA: SARS-CoV-2, NAA: NOT DETECTED

## 2020-07-27 ENCOUNTER — Other Ambulatory Visit: Payer: Self-pay | Admitting: Family Medicine

## 2020-07-27 DIAGNOSIS — Z1231 Encounter for screening mammogram for malignant neoplasm of breast: Secondary | ICD-10-CM

## 2020-09-20 ENCOUNTER — Other Ambulatory Visit: Payer: Self-pay

## 2020-09-20 ENCOUNTER — Ambulatory Visit
Admission: RE | Admit: 2020-09-20 | Discharge: 2020-09-20 | Disposition: A | Discharge status: Home / Self Care | Payer: BC Managed Care – PPO | Source: Ambulatory Visit | Attending: Family Medicine | Admitting: Family Medicine

## 2020-09-20 DIAGNOSIS — Z1231 Encounter for screening mammogram for malignant neoplasm of breast: Secondary | ICD-10-CM

## 2021-02-04 ENCOUNTER — Other Ambulatory Visit: Payer: Self-pay | Admitting: Nurse Practitioner

## 2021-02-04 DIAGNOSIS — Z780 Asymptomatic menopausal state: Secondary | ICD-10-CM

## 2022-02-07 ENCOUNTER — Other Ambulatory Visit: Payer: Self-pay | Admitting: Nurse Practitioner

## 2022-02-07 ENCOUNTER — Other Ambulatory Visit (HOSPITAL_COMMUNITY)
Admission: RE | Admit: 2022-02-07 | Discharge: 2022-02-07 | Disposition: A | Discharge status: Home / Self Care | Payer: BC Managed Care – PPO | Source: Ambulatory Visit | Attending: Nurse Practitioner | Admitting: Nurse Practitioner

## 2022-02-07 DIAGNOSIS — Z1231 Encounter for screening mammogram for malignant neoplasm of breast: Secondary | ICD-10-CM

## 2022-02-07 DIAGNOSIS — Z124 Encounter for screening for malignant neoplasm of cervix: Secondary | ICD-10-CM | POA: Diagnosis present

## 2022-02-07 DIAGNOSIS — E2839 Other primary ovarian failure: Secondary | ICD-10-CM

## 2022-02-09 LAB — CYTOLOGY - PAP
Comment: NEGATIVE
Diagnosis: NEGATIVE
High risk HPV: NEGATIVE

## 2022-02-16 ENCOUNTER — Ambulatory Visit: Payer: BC Managed Care – PPO

## 2022-03-23 ENCOUNTER — Other Ambulatory Visit: Payer: BC Managed Care – PPO

## 2022-04-14 ENCOUNTER — Ambulatory Visit
Admission: RE | Admit: 2022-04-14 | Discharge: 2022-04-14 | Disposition: A | Discharge status: Home / Self Care | Payer: BC Managed Care – PPO | Source: Ambulatory Visit | Attending: Nurse Practitioner | Admitting: Nurse Practitioner

## 2022-04-14 DIAGNOSIS — Z1231 Encounter for screening mammogram for malignant neoplasm of breast: Secondary | ICD-10-CM

## 2022-07-21 ENCOUNTER — Other Ambulatory Visit: Payer: BC Managed Care – PPO

## 2022-08-07 ENCOUNTER — Inpatient Hospital Stay: Admission: RE | Admit: 2022-08-07 | Payer: BC Managed Care – PPO | Source: Ambulatory Visit

## 2022-11-13 ENCOUNTER — Other Ambulatory Visit: Payer: Self-pay | Admitting: Family Medicine

## 2022-11-13 ENCOUNTER — Ambulatory Visit
Admission: RE | Admit: 2022-11-13 | Discharge: 2022-11-13 | Disposition: A | Discharge status: Home / Self Care | Payer: Medicare Other | Source: Ambulatory Visit | Attending: Family Medicine | Admitting: Family Medicine

## 2022-11-13 DIAGNOSIS — J189 Pneumonia, unspecified organism: Secondary | ICD-10-CM

## 2022-12-14 ENCOUNTER — Emergency Department (HOSPITAL_COMMUNITY): Payer: Medicare Other

## 2022-12-14 ENCOUNTER — Emergency Department (HOSPITAL_COMMUNITY)
Admission: EM | Admit: 2022-12-14 | Discharge: 2022-12-14 | Disposition: A | Discharge status: Home / Self Care | Payer: Medicare Other | Attending: Emergency Medicine | Admitting: Emergency Medicine

## 2022-12-14 DIAGNOSIS — R42 Dizziness and giddiness: Secondary | ICD-10-CM | POA: Insufficient documentation

## 2022-12-14 LAB — CBC
HCT: 43.1 % (ref 36.0–46.0)
Hemoglobin: 14.1 g/dL (ref 12.0–15.0)
MCH: 27.3 pg (ref 26.0–34.0)
MCHC: 32.7 g/dL (ref 30.0–36.0)
MCV: 83.5 fL (ref 80.0–100.0)
Platelets: 235 10*3/uL (ref 150–400)
RBC: 5.16 MIL/uL — ABNORMAL HIGH (ref 3.87–5.11)
RDW: 12.2 % (ref 11.5–15.5)
WBC: 6.2 10*3/uL (ref 4.0–10.5)
nRBC: 0 % (ref 0.0–0.2)

## 2022-12-14 LAB — BASIC METABOLIC PANEL
Anion gap: 11 (ref 5–15)
BUN: 16 mg/dL (ref 8–23)
CO2: 21 mmol/L — ABNORMAL LOW (ref 22–32)
Calcium: 8.8 mg/dL — ABNORMAL LOW (ref 8.9–10.3)
Chloride: 102 mmol/L (ref 98–111)
Creatinine, Ser: 0.72 mg/dL (ref 0.44–1.00)
GFR, Estimated: >60 mL/min (ref >60)
Glucose, Bld: 149 mg/dL — ABNORMAL HIGH (ref 70–99)
Potassium: 4 mmol/L (ref 3.5–5.1)
Sodium: 134 mmol/L — ABNORMAL LOW (ref 135–145)

## 2022-12-14 LAB — TROPONIN I (HIGH SENSITIVITY): Troponin I (High Sensitivity): 2 ng/L (ref <18)

## 2022-12-14 MED ORDER — METOCLOPRAMIDE HCL 5 MG/ML IJ SOLN
10.0000 mg | Freq: Once | INTRAMUSCULAR | Status: AC
  Administered 2022-12-14: 10 mg via INTRAVENOUS
  Filled 2022-12-14: qty 2

## 2022-12-14 MED ORDER — MECLIZINE HCL 25 MG PO TABS: 25.0000 mg | ORAL_TABLET | Freq: Three times a day (TID) | ORAL | 0 refills | Status: DC | PRN

## 2022-12-14 NOTE — ED Triage Notes (Signed)
Was at work, EMS got called for dizziness and felt like she was "floating". She was nauseous and had an episode of vomiting and was given Zofran 4 mg IV and 1 sublingual nitroglycerin because she was found to be hypertensive. Patient reports a dry cough for a few days but no fever.    210/120.  HR 86 CBG 100

## 2022-12-14 NOTE — Discharge Instructions (Addendum)
You most likely have vertigo from an inner ear issue.  You may have symptoms intermittently over the next week.  You can take the medication that was prescribed as needed but do not have to take it if you are not having symptoms.  Blood work and imaging today was normal.  However if your symptoms return and it is not improving with the medicine or you start having trouble speaking, numbness or weakness on one side of your body, inability to walk or vision changes you should return to the emergency room immediately.

## 2022-12-14 NOTE — ED Provider Notes (Signed)
[place] EMERGENCY DEPARTMENT AT [facility] Provider Note   CSN: [id number] Arrival date & time: 12/14/22  2120     History  Chief Complaint  Patient presents with   Dizziness   Hypertension    Brooke [name] is a 67 y.o. female.  Patient is a 67 year old female with a thyroid nodule but no other known medical problems who is presenting today with abrupt onset of feeling like her head was floating, dizziness and an episode of vomiting.  She reports this started at 730 while she was at work.  She had to sit down and reports now she feels slightly nauseated but her symptoms are much improved.  They do not seem worse when she moves her head.  She is not having a headache or neck pain.  She was noted by EMS to have significant elevated blood pressure and was given a nitroglycerin and Zofran due to her symptoms.  She denies ever having a history of elevated blood pressure does not use any drugs alcohol or tobacco.  Does not take any blood pressure medication.  She has no unilateral numbness or weakness, denies ever having any issues with walking, no speech changes or trouble swallowing.  No vision changes.  The history is provided by the patient.  Dizziness Hypertension       Home Medications Prior to Admission medications   Medication Sig Start Date End Date Taking? Authorizing Provider  meclizine (ANTIVERT) 25 MG tablet Take 1 tablet (25 mg total) by mouth 3 (three) times daily as needed for dizziness. 12/14/22  Yes Kailia Starry, Alphonzo Lemmings, MD  bisacodyl (DULCOLAX) 5 MG EC tablet Take 10 mg by mouth daily as needed for moderate constipation.    [provider]  cetirizine (ZYRTEC) 10 MG tablet Take 10 mg by mouth daily.    [provider]  ibuprofen (ADVIL,MOTRIN) 400 MG tablet Take 1 tablet (400 mg total) by mouth every 6 (six) hours as needed. 01/03/14   Sciacca, Ashok Cordia, PA-C      Allergies    Patient has no known allergies.    Review of Systems    Review of Systems  Neurological:  Positive for dizziness.    Physical Exam Updated Vital Signs BP (!) 147/79   Pulse 80   Temp 98.2 F (36.8 C) (Oral)   Resp 14   SpO2 100%  Physical Exam Vitals and nursing note reviewed.  Constitutional:      General: She is not in acute distress.    Appearance: She is well-developed.  HENT:     Head: Normocephalic and atraumatic.  Eyes:     General: No visual field deficit.    Extraocular Movements: Extraocular movements intact.     Pupils: Pupils are equal, round, and reactive to light.     Comments: No nystagmus  Cardiovascular:     Rate and Rhythm: Normal rate and regular rhythm.     Heart sounds: Normal heart sounds. No murmur heard.    No friction rub.  Pulmonary:     Effort: Pulmonary effort is normal.     Breath sounds: Normal breath sounds. No wheezing or rales.  Abdominal:     General: Bowel sounds are normal. There is no distension.     Palpations: Abdomen is soft.     Tenderness: There is no abdominal tenderness. There is no guarding or rebound.  Musculoskeletal:        General: No tenderness. Normal range of motion.     Comments:  No edema  Skin:    General: Skin is warm and dry.     Findings: No rash.  Neurological:     Mental Status: She is alert and oriented to person, place, and time.     Cranial Nerves: No cranial nerve deficit, dysarthria or facial asymmetry.     Motor: No weakness or pronator drift.     Coordination: Coordination is intact. Finger-Nose-Finger Test and Heel to [facility] Test normal.  Psychiatric:        Behavior: Behavior normal.     ED Results / Procedures / Treatments   Labs (all labs ordered are listed, but only abnormal results are displayed) Labs Reviewed  BASIC METABOLIC PANEL - Abnormal; Notable for the following components:      Result Value   Sodium 134 (*)    CO2 21 (*)    Glucose, Bld 149 (*)    Calcium 8.8 (*)    All other components within normal limits  CBC - Abnormal;  Notable for the following components:   RBC 5.16 (*)    All other components within normal limits  TROPONIN I (HIGH SENSITIVITY)  TROPONIN I (HIGH SENSITIVITY)    EKG EKG Interpretation Date/Time:  Thursday December 14 2022 21:24:26 EDT Ventricular Rate:  78 PR Interval:  150 QRS Duration:  86 QT Interval:  369 QTC Calculation: 421 R Axis:   26  Text Interpretation: Sinus rhythm No significant change since last tracing Confirmed by Gwyneth Sprout ([id number]) on 12/14/2022 9:38:30 PM  Radiology CT Head Wo Contrast  Result Date: 12/14/2022 CLINICAL DATA:  Dizziness.  Vertigo, central EXAM: CT HEAD WITHOUT CONTRAST TECHNIQUE: Contiguous axial images were obtained from the base of the skull through the vertex without intravenous contrast. RADIATION DOSE REDUCTION: This exam was performed according to the departmental dose-optimization program which includes automated exposure control, adjustment of the mA and/or kV according to patient size and/or use of iterative reconstruction technique. COMPARISON:  None Available. FINDINGS: Brain: No evidence of large-territorial acute infarction. No parenchymal hemorrhage. No mass lesion. No extra-axial collection. No mass effect or midline shift. No hydrocephalus. Basilar cisterns are patent. Vascular: No hyperdense vessel. Skull: No acute fracture or focal lesion. Sinuses/Orbits: Paranasal sinuses and mastoid air cells are clear. Bilateral lens replacement. Otherwise the orbits are unremarkable. Other: None. IMPRESSION: No acute intracranial abnormality. Electronically Signed   By: Tish Frederickson M.D.   On: 12/14/2022 22:40   DG Chest 2 View  Result Date: 12/14/2022 CLINICAL DATA:  Hypertension, dizziness EXAM: CHEST - 2 VIEW COMPARISON:  11/13/2022 FINDINGS: Frontal and lateral views of the chest demonstrate a stable cardiac silhouette. No acute airspace disease, effusion, or pneumothorax. No acute bony abnormalities. IMPRESSION: 1. Stable chest, no acute  process. Electronically Signed   By: Sharlet Salina M.D.   On: 12/14/2022 22:24    Procedures Procedures    Medications Ordered in ED Medications  metoCLOPramide (REGLAN) injection 10 mg (10 mg Intravenous Given 12/14/22 2203)    ED Course/ Medical Decision Making/ A&P                                 Medical Decision Making Amount and/or Complexity of Data Reviewed Labs: ordered. Decision-making details documented in ED Course. Radiology: ordered and independent interpretation performed. Decision-making details documented in ED Course.  Risk Prescription drug management.   Pt  presenting today with a complaint that caries a high risk for morbidity  and mortality. Here today with hypertension, dizziness where she reports it feels like her head is floating.  Patient has no focal neurologic findings such as difficulty with heel-to-shin or finger-nose on exam.  She is denying a headache and low suspicion for subarachnoid hemorrhage, vertebral dissection.  However with complaint of dizziness and hypertension concern for possible stroke.  Symptoms started at 730 but patient has an NIH of 0 at this time.  I independently interpreted patient's labs and EKG.  EKG without acute findings, CBC, BMP, troponin are within normal limits.  Patient had a TSH done earlier this month which was also normal.  I have independently visualized and interpreted pt's images today.  Head CT without evidence of hemorrhage today radiology reports no signs of stroke.  Chest x-ray was normal. 11:30 PM Pt on re-evaluation states she is feeling normal.  No dizziness or symptoms at this time.  BP improving.  Patient was able to ambulate here without any difficulty.  No ataxia noted.  At this time low suspicion for stroke.  Suspect most likely peripheral vertigo which is now resolved.            Final Clinical Impression(s) / ED Diagnoses Final diagnoses:  Vertigo    Rx / DC Orders ED Discharge Orders           Ordered    meclizine (ANTIVERT) 25 MG tablet  3 times daily PRN        12/14/22 2325              Gwyneth Sprout, MD 12/14/22 2334

## 2022-12-15 LAB — TROPONIN I (HIGH SENSITIVITY): Troponin I (High Sensitivity): 2 ng/L (ref <18)

## 2023-01-08 ENCOUNTER — Other Ambulatory Visit (HOSPITAL_COMMUNITY): Payer: Self-pay | Admitting: Family Medicine

## 2023-01-08 DIAGNOSIS — R519 Headache, unspecified: Secondary | ICD-10-CM

## 2023-01-13 ENCOUNTER — Ambulatory Visit (HOSPITAL_COMMUNITY)
Admission: RE | Admit: 2023-01-13 | Discharge: 2023-01-13 | Disposition: A | Discharge status: Home / Self Care | Payer: Medicare Other | Source: Ambulatory Visit | Attending: Family Medicine | Admitting: Family Medicine

## 2023-01-13 DIAGNOSIS — R519 Headache, unspecified: Secondary | ICD-10-CM | POA: Diagnosis present

## 2023-01-13 MED ORDER — GADOBUTROL 1 MMOL/ML IV SOLN
7.0000 mL | Freq: Once | INTRAVENOUS | Status: AC | PRN
  Administered 2023-01-13: 7 mL via INTRAVENOUS

## 2023-01-30 ENCOUNTER — Ambulatory Visit (INDEPENDENT_AMBULATORY_CARE_PROVIDER_SITE_OTHER): Payer: Medicare Other | Admitting: Neurology

## 2023-01-30 ENCOUNTER — Encounter: Payer: Self-pay | Admitting: Neurology

## 2023-01-30 VITALS — BP 169/92 | Ht 59.0 in | Wt 147.0 lb

## 2023-01-30 DIAGNOSIS — G44219 Episodic tension-type headache, not intractable: Secondary | ICD-10-CM | POA: Diagnosis not present

## 2023-01-30 DIAGNOSIS — R030 Elevated blood-pressure reading, without diagnosis of hypertension: Secondary | ICD-10-CM

## 2023-01-30 NOTE — Progress Notes (Signed)
[facility]  PATIENT: Brooke [name] DOB: November 21, 1955  REQUESTING CLINICIAN: Mila Palmer, MD HISTORY FROM: Patient/Daughter  REASON FOR VISIT: Headaches    HISTORICAL  CHIEF COMPLAINT:  Chief Complaint  Patient presents with   Headache    Rm12, daughter present, NP for Recurrent headache: 4/7 days, triggers:unidentifiable, abnormal brain scan    HISTORY OF PRESENT ILLNESS:  This 67 year old woman with no reported past medical history who is presenting with her daughter Brooke [name] with complaint of headache.  Patient reports the headache started a month ago after she presented to the ED for vertigo.  At that time she did have a Noncon head CT which was negative.  She described the headache as aching pain on the top of the head that radiates to the left side of her neck.  Headaches usually last less than 5-minutes and subsides after Advil.  These headaches are episodic.  She can stay 3 weeks without headache.  Denies any migrainous features.  Never had this headache in the past.  She did have an MRI done due to the headache, showed nonspecific FLAIR hyperintensity, otherwise no acute abnormality.  Headache History and Characteristics: Onset: A month ago  Location: Top  Quality:  aching  Intensity: 5/10.  Duration: Less than 5 min Migrainous Features: None.  Aura: No  History of brain injury or tumor: No  Family history: denies Motion sickness: no Cardiac history: no  OTC: Advil   Prior prophylaxis: Propranolol: No  Verapamil:No TCA: No Topamax: No Depakote: No Effexor: No Cymbalta: No Neurontin:No  Prior abortives: Triptan: No Anti-emetic: No Steroids: No Ergotamine suppository: No    OTHER MEDICAL CONDITIONS: None reported    REVIEW OF SYSTEMS: Full 14 system review of systems performed and negative with exception of: As noted in the HPI   ALLERGIES: No Known Allergies  HOME MEDICATIONS: Outpatient Medications Prior to Visit   Medication Sig Dispense Refill   bisacodyl (DULCOLAX) 5 MG EC tablet Take 10 mg by mouth daily as needed for moderate constipation.     cetirizine (ZYRTEC) 10 MG tablet Take 10 mg by mouth daily.     ibuprofen (ADVIL,MOTRIN) 400 MG tablet Take 1 tablet (400 mg total) by mouth every 6 (six) hours as needed. 15 tablet 0   meclizine (ANTIVERT) 25 MG tablet Take 1 tablet (25 mg total) by mouth 3 (three) times daily as needed for dizziness. 20 tablet 0   melatonin 5 MG TABS Take 5 mg by mouth at bedtime.     No facility-administered medications prior to visit.    PAST MEDICAL HISTORY: History reviewed. No pertinent past medical history.  PAST SURGICAL HISTORY: Past Surgical History:  Procedure Laterality Date   BREAST BIOPSY     NECK SURGERY      FAMILY HISTORY: Family History  Problem Relation Age of Onset   Breast cancer Neg Hx     SOCIAL HISTORY: Social History   Socioeconomic History   Marital status: Widowed    Spouse name: Not on file   Number of children: 3   Years of education: Not on file   Highest education level: 8th grade  Occupational History   Not on file  Tobacco Use   Smoking status: Never   Smokeless tobacco: Never  Vaping Use   Vaping status: Never Used  Substance and Sexual Activity   Alcohol use: No   Drug use: No   Sexual activity: Not on file  Other Topics Concern   Not on  file  Social History Narrative   Not on file   Social Determinants of Health   Financial Resource Strain: Not on file  Food Insecurity: Not on file  Transportation Needs: Not on file  Physical Activity: Not on file  Stress: Not on file  Social Connections: Not on file  Intimate Partner Violence: Not on file    PHYSICAL EXAM  GENERAL EXAM/CONSTITUTIONAL: Vitals:  Vitals:   01/30/23 0756  BP: (!) 169/92  Weight: 147 lb (66.7 kg)  Height: 4\' 11"  (1.499 m)   Body mass index is 29.69 kg/m. Wt Readings from Last 3 Encounters:  01/30/23 147 lb (66.7 kg)    Patient is in no distress; well developed, nourished and groomed; neck is supple  MUSCULOSKELETAL: Gait, strength, tone, movements noted in Neurologic exam below  NEUROLOGIC: MENTAL STATUS:      No data to display         awake, alert, oriented to person, place and time recent and remote memory intact normal attention and concentration language fluent, comprehension intact, naming intact fund of knowledge appropriate  CRANIAL NERVE:  2nd - no papilledema or hemorrhages on fundoscopic exam 2nd, 3rd, 4th, 6th - pupils equal and reactive to light, visual fields full to confrontation, extraocular muscles intact, no nystagmus 5th - facial sensation symmetric 7th - facial strength symmetric 8th - hearing intact 9th - palate elevates symmetrically, uvula midline 11th - shoulder shrug symmetric 12th - tongue protrusion midline  MOTOR:  normal bulk and tone, full strength in the BUE, BLE  SENSORY:  normal and symmetric to light touch  COORDINATION:  finger-nose-finger, fine finger movements normal  GAIT/STATION:  normal    DIAGNOSTIC DATA (LABS, IMAGING, TESTING) - I reviewed patient records, labs, notes, testing and imaging myself where available.  Lab Results  Component Value Date   WBC 6.2 12/14/2022   HGB 14.1 12/14/2022   HCT 43.1 12/14/2022   MCV 83.5 12/14/2022   PLT 235 12/14/2022      Component Value Date/Time   NA 134 (L) 12/14/2022 2130   K 4.0 12/14/2022 2130   CL 102 12/14/2022 2130   CO2 21 (L) 12/14/2022 2130   GLUCOSE 149 (H) 12/14/2022 2130   BUN 16 12/14/2022 2130   CREATININE 0.72 12/14/2022 2130   CALCIUM 8.8 (L) 12/14/2022 2130   PROT 7.6 01/03/2014 1025   ALBUMIN 3.9 01/03/2014 1025   AST 24 01/03/2014 1025   ALT 44 (H) 01/03/2014 1025   ALKPHOS 91 01/03/2014 1025   BILITOT 0.3 01/03/2014 1025   GFRNONAA >60 12/14/2022 2130   GFRAA >60 07/17/2017 1646   No results found for: "CHOL", "HDL", "LDLCALC", "LDLDIRECT", "TRIG",  "CHOLHDL" No results found for: "HGBA1C" No results found for: "VITAMINB12" No results found for: "TSH"  Head CT 12/14/2022 No acute intracranial abnormality.   MRI Brain 01/13/2023 1.  No acute intracranial abnormality. 2. Moderate for age nonenhancing and nonspecific cerebral white matter signal changes, largely not apparent by CT last month. Differential considerations include accelerated/hereditary small vessel ischemia, sequelae of trauma, hypercoagulable state, vasculitis, migraines, prior infection, or chronic demyelination.    ASSESSMENT AND PLAN  67 y.o. year old female with no reported past medical history who is presenting with occasional headaches.  Based on description, these are likely tension type headache.  Headaches are better with Advil.  Advised her to continue with Advil and Tylenol as needed.  Her imaging did not show any acute abnormality.   In terms of the elevated blood  pressure reading today, advised patient to check her blood pressure at home, keep a diary and follow-up with her PCP.  She voiced understanding.  Return as needed   1. Episodic tension-type headache, not intractable   2. Elevated blood pressure reading     Patient Instructions  Monitor blood pressure at home and keep a diary Continue with Advil/Tylenol as needed for the episodic headaches  Increase fluid intake  Continue to follow up with PCP  Return as needed    No orders of the defined types were placed in this encounter.   No orders of the defined types were placed in this encounter.   Return if symptoms worsen or fail to improve.    Windell Norfolk, MD 01/30/2023, 8:43 AM  [facility] [address], [address] [place], [place] [address] [phone]

## 2023-01-30 NOTE — Patient Instructions (Addendum)
Monitor blood pressure at home and keep a diary Continue with Advil/Tylenol as needed for the episodic headaches  Increase fluid intake  Continue to follow up with PCP  Return as needed

## 2023-02-16 ENCOUNTER — Other Ambulatory Visit: Payer: Self-pay | Admitting: Nurse Practitioner

## 2023-02-16 DIAGNOSIS — Z780 Asymptomatic menopausal state: Secondary | ICD-10-CM

## 2023-04-16 ENCOUNTER — Other Ambulatory Visit: Payer: Self-pay | Admitting: Family Medicine

## 2023-04-16 DIAGNOSIS — Z1231 Encounter for screening mammogram for malignant neoplasm of breast: Secondary | ICD-10-CM

## 2023-05-15 ENCOUNTER — Ambulatory Visit
Admission: RE | Admit: 2023-05-15 | Discharge: 2023-05-15 | Disposition: A | Discharge status: Home / Self Care | Payer: Medicare Other | Source: Ambulatory Visit | Attending: Family Medicine | Admitting: Family Medicine

## 2023-05-15 DIAGNOSIS — Z1231 Encounter for screening mammogram for malignant neoplasm of breast: Secondary | ICD-10-CM

## 2023-09-12 ENCOUNTER — Emergency Department (HOSPITAL_BASED_OUTPATIENT_CLINIC_OR_DEPARTMENT_OTHER)

## 2023-09-12 ENCOUNTER — Other Ambulatory Visit: Payer: Self-pay

## 2023-09-12 ENCOUNTER — Emergency Department (HOSPITAL_BASED_OUTPATIENT_CLINIC_OR_DEPARTMENT_OTHER)
Admission: EM | Admit: 2023-09-12 | Discharge: 2023-09-12 | Disposition: A | Discharge status: Home / Self Care | Attending: Emergency Medicine | Admitting: Emergency Medicine

## 2023-09-12 ENCOUNTER — Encounter (HOSPITAL_BASED_OUTPATIENT_CLINIC_OR_DEPARTMENT_OTHER): Payer: Self-pay | Admitting: *Deleted

## 2023-09-12 DIAGNOSIS — R42 Dizziness and giddiness: Secondary | ICD-10-CM | POA: Insufficient documentation

## 2023-09-12 LAB — CBC WITH DIFFERENTIAL/PLATELET
Abs Immature Granulocytes: 0.04 10*3/uL (ref 0.00–0.07)
Basophils Absolute: 0.1 10*3/uL (ref 0.0–0.1)
Basophils Relative: 1 %
Eosinophils Absolute: 0.2 10*3/uL (ref 0.0–0.5)
Eosinophils Relative: 2 %
HCT: 44.8 % (ref 36.0–46.0)
Hemoglobin: 14.7 g/dL (ref 12.0–15.0)
Immature Granulocytes: 0 %
Lymphocytes Relative: 23 %
Lymphs Abs: 2.4 10*3/uL (ref 0.7–4.0)
MCH: 28.2 pg (ref 26.0–34.0)
MCHC: 32.8 g/dL (ref 30.0–36.0)
MCV: 85.8 fL (ref 80.0–100.0)
Monocytes Absolute: 0.6 10*3/uL (ref 0.1–1.0)
Monocytes Relative: 6 %
Neutro Abs: 6.9 10*3/uL (ref 1.7–7.7)
Neutrophils Relative %: 68 %
Platelets: 287 10*3/uL (ref 150–400)
RBC: 5.22 MIL/uL — ABNORMAL HIGH (ref 3.87–5.11)
RDW: 12.8 % (ref 11.5–15.5)
WBC: 10.2 10*3/uL (ref 4.0–10.5)
nRBC: 0 % (ref 0.0–0.2)

## 2023-09-12 LAB — BASIC METABOLIC PANEL WITH GFR
Anion gap: 13 (ref 5–15)
BUN: 22 mg/dL (ref 8–23)
CO2: 26 mmol/L (ref 22–32)
Calcium: 9.8 mg/dL (ref 8.9–10.3)
Chloride: 101 mmol/L (ref 98–111)
Creatinine, Ser: 0.58 mg/dL (ref 0.44–1.00)
GFR, Estimated: >60 mL/min (ref >60)
Glucose, Bld: 121 mg/dL — ABNORMAL HIGH (ref 70–99)
Potassium: 4.5 mmol/L (ref 3.5–5.1)
Sodium: 140 mmol/L (ref 135–145)

## 2023-09-12 LAB — TROPONIN T, HIGH SENSITIVITY: Troponin T High Sensitivity: <15 ng/L (ref <19)

## 2023-09-12 MED ORDER — ONDANSETRON HCL 4 MG/2ML IJ SOLN
4.0000 mg | Freq: Once | INTRAMUSCULAR | Status: AC
  Administered 2023-09-12: 4 mg via INTRAVENOUS
  Filled 2023-09-12: qty 2

## 2023-09-12 MED ORDER — ACETAMINOPHEN 500 MG PO TABS
1000.0000 mg | ORAL_TABLET | Freq: Once | ORAL | Status: AC
  Administered 2023-09-12: 1000 mg via ORAL
  Filled 2023-09-12: qty 2

## 2023-09-12 MED ORDER — ONDANSETRON 4 MG PO TBDP: 4.0000 mg | ORAL_TABLET | Freq: Three times a day (TID) | ORAL | 0 refills | Status: AC | PRN

## 2023-09-12 MED ORDER — MECLIZINE HCL 25 MG PO TABS
25.0000 mg | ORAL_TABLET | Freq: Once | ORAL | Status: AC
  Administered 2023-09-12: 25 mg via ORAL
  Filled 2023-09-12: qty 1

## 2023-09-12 MED ORDER — MECLIZINE HCL 25 MG PO TABS: 25.0000 mg | ORAL_TABLET | Freq: Three times a day (TID) | ORAL | 0 refills | Status: AC | PRN

## 2023-09-12 NOTE — ED Notes (Signed)
 Reviewed AVS/discharge instruction with patient. Time allotted for and all questions answered. Patient is agreeable for d/c and escorted to ed exit by staff.

## 2023-09-12 NOTE — ED Provider Notes (Signed)
 [place] EMERGENCY DEPARTMENT AT [facility] Provider Note   CSN: [id number] Arrival date & time: 09/12/23  1923     History  Chief Complaint  Patient presents with   Dizziness    Brooke [name] is a 68 y.o. female.  Patient here with dizziness and headache intermittently since this morning.  History of the same.  Diagnosed with vertigo in the past.  She is also has some shortness of breath this week but none currently.  Denies any chest pain weakness numbness tingling.  No vision loss.  Dizziness will come on all of a sudden with some nausea feeling.  Seems worse when she bends over or stands up quickly.  No symptoms at rest.  She has had success with meclizine  in the past.  She follows with neurology for headaches.  She had an MRI about a year ago that was unremarkable.  Some nonspecific changes.  The history is provided by the patient.       Home Medications Prior to Admission medications   Medication Sig Start Date End Date Taking? Authorizing Provider  ondansetron  (ZOFRAN -ODT) 4 MG disintegrating tablet Take 1 tablet (4 mg total) by mouth every 8 (eight) hours as needed for nausea or vomiting. 09/12/23  Yes Brooke Tapia, DO  bisacodyl (DULCOLAX) 5 MG EC tablet Take 10 mg by mouth daily as needed for moderate constipation.    [provider]  cetirizine (ZYRTEC) 10 MG tablet Take 10 mg by mouth daily.    [provider]  ibuprofen  (ADVIL ,MOTRIN ) 400 MG tablet Take 1 tablet (400 mg total) by mouth every 6 (six) hours as needed. 01/03/14   Sciacca, Marissa, PA-C  meclizine  (ANTIVERT ) 25 MG tablet Take 1 tablet (25 mg total) by mouth 3 (three) times daily as needed for dizziness. 09/12/23   Brooke Narine, DO  melatonin 5 MG TABS Take 5 mg by mouth at bedtime. 12/21/22   [provider]      Allergies    Patient has no known allergies.    Review of Systems   Review of Systems  Physical Exam Updated Vital Signs BP (!) 149/75   Pulse 73    Temp 97.7 F (36.5 C) (Oral)   Resp 18   Ht 4\' 11"  (1.499 m)   SpO2 97%   BMI 29.69 kg/m  Physical Exam Vitals and nursing note reviewed.  Constitutional:      General: She is not in acute distress.    Appearance: She is well-developed. She is not ill-appearing.  HENT:     Head: Normocephalic and atraumatic.     Nose: Nose normal.     Mouth/Throat:     Mouth: Mucous membranes are moist.  Eyes:     Extraocular Movements: Extraocular movements intact.     Conjunctiva/sclera: Conjunctivae normal.     Pupils: Pupils are equal, round, and reactive to light.  Cardiovascular:     Rate and Rhythm: Normal rate and regular rhythm.     Pulses: Normal pulses.     Heart sounds: Normal heart sounds. No murmur heard. Pulmonary:     Effort: Pulmonary effort is normal. No respiratory distress.     Breath sounds: Normal breath sounds.  Abdominal:     Palpations: Abdomen is soft.     Tenderness: There is no abdominal tenderness.  Musculoskeletal:        General: No swelling. Normal range of motion.     Cervical back: Normal range of motion and neck supple.  Right lower leg: No edema.     Left lower leg: No edema.  Skin:    General: Skin is warm and dry.     Capillary Refill: Capillary refill takes less than 2 seconds.  Neurological:     General: No focal deficit present.     Mental Status: She is alert and oriented to person, place, and time.     Cranial Nerves: No cranial nerve deficit.     Sensory: No sensory deficit.     Motor: No weakness.     Coordination: Coordination normal.     Comments: 5+ out of 5 throughout, normal sensation, no drift, normal gait, normal finger-to-nose finger, normal speech, no nystagmus  Psychiatric:        Mood and Affect: Mood normal.     ED Results / Procedures / Treatments   Labs (all labs ordered are listed, but only abnormal results are displayed) Labs Reviewed  CBC WITH DIFFERENTIAL/PLATELET - Abnormal; Notable for the following components:       Result Value   RBC 5.22 (*)    All other components within normal limits  BASIC METABOLIC PANEL WITH GFR - Abnormal; Notable for the following components:   Glucose, Bld 121 (*)    All other components within normal limits  TROPONIN T, HIGH SENSITIVITY    EKG EKG Interpretation Date/Time:  Wednesday Sep 12 2023 19:39:51 EDT Ventricular Rate:  75 PR Interval:  146 QRS Duration:  89 QT Interval:  380 QTC Calculation: 425 R Axis:   43  Text Interpretation: Sinus rhythm Confirmed by Lowery Rue [phone]) on 09/12/2023 8:32:39 PM  Radiology CT Head Wo Contrast Result Date: 09/12/2023 CLINICAL DATA:  Headache dizziness EXAM: CT HEAD WITHOUT CONTRAST TECHNIQUE: Contiguous axial images were obtained from the base of the skull through the vertex without intravenous contrast. RADIATION DOSE REDUCTION: This exam was performed according to the departmental dose-optimization program which includes automated exposure control, adjustment of the mA and/or kV according to patient size and/or use of iterative reconstruction technique. COMPARISON:  CT 12/14/2022 FINDINGS: Brain: No acute territorial infarction, hemorrhage or intracranial mass. The ventricles are nonenlarged Vascular: No hyperdense vessel or unexpected calcification. Skull: Normal. Negative for fracture or focal lesion. Sinuses/Orbits: No acute finding. Other: None IMPRESSION: Negative non contrasted CT appearance of the brain. Electronically Signed   By: Esmeralda Hedge M.D.   On: 09/12/2023 20:01   DG Chest Portable 1 View Result Date: 09/12/2023 CLINICAL DATA:  Shortness of breath EXAM: PORTABLE CHEST 1 VIEW COMPARISON:  12/14/2022 FINDINGS: The heart size and mediastinal contours are within normal limits. Both lungs are clear. The visualized skeletal structures are unremarkable. IMPRESSION: No active disease. Electronically Signed   By: Esmeralda Hedge M.D.   On: 09/12/2023 19:59    Procedures Procedures    Medications Ordered in  ED Medications  meclizine  (ANTIVERT ) tablet 25 mg (25 mg Oral Given 09/12/23 2010)  ondansetron (ZOFRAN) injection 4 mg (4 mg Intravenous Given 09/12/23 2010)  acetaminophen (TYLENOL) tablet 1,000 mg (1,000 mg Oral Given 09/12/23 2010)    ED Course/ Medical Decision Making/ A&P                                 Medical Decision Making Amount and/or Complexity of Data Reviewed Labs: ordered. Radiology: ordered.  Risk OTC drugs. Prescription drug management.   MYKELLE COCKERELL is here with headache dizziness.  Also some shortness of  breath this past week.  Currently she is not having any headache.  Dizziness also improved.  No shortness of breath or chest pain.  She is got mildly elevated blood pressure but otherwise normal vitals.  EKG shows sinus rhythm.  No ischemic changes per my review interpretation of EKG.  Per chart review it looks like she follows with neurology for headaches.  She had MRI last year that was fairly unremarkable.  She has been diagnosed with vertigo in the past.  She is taking meclizine  with success.  Symptoms started this morning worse with movement better with rest.  Felt nauseous when she had the dizzy episodes.  She has not had any recent surgery or travel.  Not had any chest pain.  No leg swelling.  She had a mild headache but that is mostly improved as well.  She is neurologically intact on exam.  She is ambulatory in the room.  There is no nystagmus.  She has no fever.  No neck pain.  Differential diagnosis likely vertigo.  Seems less likely to be stroke or head bleed.  Will get a CT of the head.  Will give meclizine  and Zofran.  For her shortness of breath we will investigate this with chest x-ray basic labs troponin.  I have no concern for PE.  Wells criteria 0.  She does not have any major cardiac risk factors.  Overall my suspicion is that there is likely peripheral vertigo going on.  She is minimally symptomatic at this time.  Per review interpretation labs no  significant leukocytosis anemia or electrolyte abnormality.  Troponin normal.  Head CT unremarkable.  Chest x-ray without any evidence of pneumonia or pneumothorax.  Overall she is not really symptomatic.  I think that this is a peripheral vertigo.  She is ambulatory without any issues.  Will prescribe meclizine  and Zofran.  Told to return if symptoms worsen.  Discharge.  Follow-up with primary care doctor.  May need ENT referral if not getting better.  This chart was dictated using voice recognition software.  Despite best efforts to proofread,  errors can occur which can change the documentation meaning.         Final Clinical Impression(s) / ED Diagnoses Final diagnoses:  Dizziness  Vertigo    Rx / DC Orders ED Discharge Orders          Ordered    meclizine  (ANTIVERT ) 25 MG tablet  3 times daily PRN        09/12/23 2102    ondansetron (ZOFRAN-ODT) 4 MG disintegrating tablet  Every 8 hours PRN        09/12/23 2102              Lowery Rue, DO 09/12/23 2301

## 2023-09-12 NOTE — ED Triage Notes (Signed)
 Dizziness starting suddenly 3 hours ago. Intermittent dizziness this morning that subsided before work.   Intermittent shortness of breath for the past week.

## 2023-09-12 NOTE — ED Notes (Signed)
 Patient transported to CT

## 2023-09-12 NOTE — ED Notes (Signed)
 MD at bedside.

## 2023-10-02 ENCOUNTER — Other Ambulatory Visit: Payer: Medicare Other
